# Patient Record
Sex: Female | Born: 1950 | Race: White | Hispanic: No | Marital: Married | Smoking: Never smoker
Health system: Southern US, Community
[De-identification: ages and names within clinical notes are randomized; demographics above are authoritative.]

## PROBLEM LIST (undated history)

## (undated) DIAGNOSIS — M1990 Unspecified osteoarthritis, unspecified site: Secondary | ICD-10-CM

## (undated) DIAGNOSIS — N951 Menopausal and female climacteric states: Secondary | ICD-10-CM

## (undated) DIAGNOSIS — H509 Unspecified strabismus: Secondary | ICD-10-CM

## (undated) DIAGNOSIS — E785 Hyperlipidemia, unspecified: Secondary | ICD-10-CM

## (undated) DIAGNOSIS — N183 Chronic kidney disease, stage 3 unspecified: Secondary | ICD-10-CM

## (undated) DIAGNOSIS — L409 Psoriasis, unspecified: Secondary | ICD-10-CM

## (undated) DIAGNOSIS — Z86010 Personal history of colonic polyps: Secondary | ICD-10-CM

## (undated) DIAGNOSIS — H269 Unspecified cataract: Secondary | ICD-10-CM

## (undated) DIAGNOSIS — R232 Flushing: Secondary | ICD-10-CM

## (undated) DIAGNOSIS — E039 Hypothyroidism, unspecified: Secondary | ICD-10-CM

## (undated) DIAGNOSIS — F419 Anxiety disorder, unspecified: Secondary | ICD-10-CM

## (undated) DIAGNOSIS — T7840XA Allergy, unspecified, initial encounter: Secondary | ICD-10-CM

## (undated) HISTORY — DX: Anxiety disorder, unspecified: F41.9

## (undated) HISTORY — DX: Unspecified strabismus: H50.9

## (undated) HISTORY — DX: Hyperlipidemia, unspecified: E78.5

## (undated) HISTORY — DX: Unspecified osteoarthritis, unspecified site: M19.90

## (undated) HISTORY — DX: Menopausal and female climacteric states: N95.1

## (undated) HISTORY — DX: Hypothyroidism, unspecified: E03.9

## (undated) HISTORY — DX: Psoriasis, unspecified: L40.9

## (undated) HISTORY — DX: Allergy, unspecified, initial encounter: T78.40XA

## (undated) HISTORY — DX: Chronic kidney disease, stage 3 unspecified: N18.30

## (undated) HISTORY — DX: Flushing: R23.2

## (undated) HISTORY — PX: POLYPECTOMY: SHX149

## (undated) HISTORY — DX: Unspecified cataract: H26.9

## (undated) HISTORY — DX: Personal history of colonic polyps: Z86.010

## (undated) HISTORY — PX: TUBAL LIGATION: SHX77

## (undated) HISTORY — PX: COLONOSCOPY: SHX174

## (undated) HISTORY — PX: APPENDECTOMY: SHX54

## (undated) HISTORY — DX: Chronic kidney disease, stage 3 (moderate): N18.3

---

## 1999-05-25 ENCOUNTER — Other Ambulatory Visit: Admission: RE | Admit: 1999-05-25 | Discharge: 1999-05-25 | Payer: Self-pay | Admitting: Obstetrics and Gynecology

## 1999-05-25 ENCOUNTER — Encounter (INDEPENDENT_AMBULATORY_CARE_PROVIDER_SITE_OTHER): Payer: Self-pay

## 1999-08-15 ENCOUNTER — Other Ambulatory Visit: Admission: RE | Admit: 1999-08-15 | Discharge: 1999-08-15 | Payer: Self-pay | Admitting: Obstetrics and Gynecology

## 1999-09-14 ENCOUNTER — Encounter: Payer: Self-pay | Admitting: Obstetrics and Gynecology

## 1999-09-14 ENCOUNTER — Encounter: Admission: RE | Admit: 1999-09-14 | Discharge: 1999-09-14 | Payer: Self-pay | Admitting: Obstetrics and Gynecology

## 2000-11-14 ENCOUNTER — Other Ambulatory Visit: Admission: RE | Admit: 2000-11-14 | Discharge: 2000-11-14 | Payer: Self-pay | Admitting: Obstetrics and Gynecology

## 2000-12-05 ENCOUNTER — Encounter: Payer: Self-pay | Admitting: Obstetrics and Gynecology

## 2000-12-05 ENCOUNTER — Encounter: Admission: RE | Admit: 2000-12-05 | Discharge: 2000-12-05 | Payer: Self-pay | Admitting: Obstetrics and Gynecology

## 2001-11-20 ENCOUNTER — Other Ambulatory Visit: Admission: RE | Admit: 2001-11-20 | Discharge: 2001-11-20 | Payer: Self-pay | Admitting: Obstetrics and Gynecology

## 2001-12-18 ENCOUNTER — Encounter: Payer: Self-pay | Admitting: Obstetrics and Gynecology

## 2001-12-18 ENCOUNTER — Encounter: Admission: RE | Admit: 2001-12-18 | Discharge: 2001-12-18 | Payer: Self-pay | Admitting: Obstetrics and Gynecology

## 2002-11-24 ENCOUNTER — Other Ambulatory Visit: Admission: RE | Admit: 2002-11-24 | Discharge: 2002-11-24 | Payer: Self-pay | Admitting: Obstetrics and Gynecology

## 2003-03-09 ENCOUNTER — Encounter: Payer: Self-pay | Admitting: Obstetrics and Gynecology

## 2003-03-09 ENCOUNTER — Encounter: Admission: RE | Admit: 2003-03-09 | Discharge: 2003-03-09 | Payer: Self-pay | Admitting: Obstetrics and Gynecology

## 2004-03-07 ENCOUNTER — Encounter: Payer: Self-pay | Admitting: Internal Medicine

## 2004-08-31 ENCOUNTER — Encounter: Admission: RE | Admit: 2004-08-31 | Discharge: 2004-08-31 | Payer: Self-pay | Admitting: Obstetrics and Gynecology

## 2004-10-12 ENCOUNTER — Ambulatory Visit: Payer: Self-pay | Admitting: Internal Medicine

## 2005-01-18 ENCOUNTER — Other Ambulatory Visit: Admission: RE | Admit: 2005-01-18 | Discharge: 2005-01-18 | Payer: Self-pay | Admitting: Obstetrics and Gynecology

## 2006-01-03 ENCOUNTER — Ambulatory Visit: Payer: Self-pay | Admitting: Internal Medicine

## 2006-01-22 ENCOUNTER — Ambulatory Visit: Payer: Self-pay | Admitting: Internal Medicine

## 2006-02-05 ENCOUNTER — Encounter: Admission: RE | Admit: 2006-02-05 | Discharge: 2006-02-05 | Payer: Self-pay | Admitting: Obstetrics and Gynecology

## 2006-04-17 ENCOUNTER — Ambulatory Visit: Payer: Self-pay | Admitting: Internal Medicine

## 2006-10-10 ENCOUNTER — Ambulatory Visit: Payer: Self-pay | Admitting: Internal Medicine

## 2006-10-28 DIAGNOSIS — Z86010 Personal history of colonic polyps: Secondary | ICD-10-CM

## 2006-10-28 DIAGNOSIS — Z860101 Personal history of adenomatous and serrated colon polyps: Secondary | ICD-10-CM

## 2006-10-28 HISTORY — DX: Personal history of colonic polyps: Z86.010

## 2006-10-28 HISTORY — DX: Personal history of adenomatous and serrated colon polyps: Z86.0101

## 2006-10-28 LAB — CONVERTED CEMR LAB: Pap Smear: NORMAL

## 2007-02-19 ENCOUNTER — Encounter: Admission: RE | Admit: 2007-02-19 | Discharge: 2007-02-19 | Payer: Self-pay | Admitting: Obstetrics and Gynecology

## 2007-02-19 ENCOUNTER — Encounter: Payer: Self-pay | Admitting: Internal Medicine

## 2007-03-08 HISTORY — PX: COLONOSCOPY W/ POLYPECTOMY: SHX1380

## 2007-04-27 DIAGNOSIS — F411 Generalized anxiety disorder: Secondary | ICD-10-CM | POA: Insufficient documentation

## 2007-05-14 ENCOUNTER — Ambulatory Visit: Payer: Self-pay | Admitting: Internal Medicine

## 2007-05-14 LAB — CONVERTED CEMR LAB
ALT: 14 units/L (ref 0–35)
AST: 13 units/L (ref 0–37)
Albumin: 3.7 g/dL (ref 3.5–5.2)
Alkaline Phosphatase: 71 units/L (ref 39–117)
BUN: 12 mg/dL (ref 6–23)
Basophils Absolute: 0 10*3/uL (ref 0.0–0.1)
Basophils Relative: 0.5 % (ref 0.0–1.0)
Bilirubin, Direct: 0.1 mg/dL (ref 0.0–0.3)
CO2: 30 meq/L (ref 19–32)
Calcium: 9.3 mg/dL (ref 8.4–10.5)
Chloride: 104 meq/L (ref 96–112)
Cholesterol: 235 mg/dL (ref 0–200)
Creatinine, Ser: 1.1 mg/dL (ref 0.4–1.2)
Direct LDL: 146.8 mg/dL
Eosinophils Absolute: 0.1 10*3/uL (ref 0.0–0.6)
Eosinophils Relative: 1.2 % (ref 0.0–5.0)
GFR calc Af Amer: 66 mL/min
GFR calc non Af Amer: 54 mL/min
Glucose, Bld: 81 mg/dL (ref 70–99)
HCT: 37.6 % (ref 36.0–46.0)
HDL: 70.7 mg/dL (ref >39.0)
Hemoglobin: 13 g/dL (ref 12.0–15.0)
Lymphocytes Relative: 30.6 % (ref 12.0–46.0)
MCHC: 34.7 g/dL (ref 30.0–36.0)
MCV: 93.5 fL (ref 78.0–100.0)
Monocytes Absolute: 0.3 10*3/uL (ref 0.2–0.7)
Monocytes Relative: 6.2 % (ref 3.0–11.0)
Neutro Abs: 3.1 10*3/uL (ref 1.4–7.7)
Neutrophils Relative %: 61.5 % (ref 43.0–77.0)
Platelets: 291 10*3/uL (ref 150–400)
Potassium: 4.5 meq/L (ref 3.5–5.1)
RBC: 4.02 M/uL (ref 3.87–5.11)
RDW: 11.8 % (ref 11.5–14.6)
Sodium: 140 meq/L (ref 135–145)
TSH: 5.49 microintl units/mL (ref 0.35–5.50)
Total Bilirubin: 0.6 mg/dL (ref 0.3–1.2)
Total CHOL/HDL Ratio: 3.3
Total Protein: 6.9 g/dL (ref 6.0–8.3)
Triglycerides: 134 mg/dL (ref 0–149)
VLDL: 27 mg/dL (ref 0–40)
WBC: 5 10*3/uL (ref 4.5–10.5)

## 2007-05-21 ENCOUNTER — Ambulatory Visit: Payer: Self-pay | Admitting: Internal Medicine

## 2008-02-22 ENCOUNTER — Encounter: Admission: RE | Admit: 2008-02-22 | Discharge: 2008-02-22 | Payer: Self-pay | Admitting: Internal Medicine

## 2008-03-28 LAB — CONVERTED CEMR LAB: Pap Smear: NORMAL

## 2008-11-10 ENCOUNTER — Ambulatory Visit: Payer: Self-pay | Admitting: Internal Medicine

## 2008-11-10 LAB — CONVERTED CEMR LAB
Albumin: 3.8 g/dL (ref 3.5–5.2)
Alkaline Phosphatase: 70 units/L (ref 39–117)
BUN: 9 mg/dL (ref 6–23)
Bilirubin Urine: NEGATIVE
Bilirubin, Direct: 0.1 mg/dL (ref 0.0–0.3)
Blood in Urine, dipstick: NEGATIVE
Chloride: 105 meq/L (ref 96–112)
Creatinine, Ser: 0.8 mg/dL (ref 0.4–1.2)
Direct LDL: 143.4 mg/dL
Eosinophils Relative: 0.9 % (ref 0.0–5.0)
GFR calc non Af Amer: 78 mL/min
Glucose, Urine, Semiquant: NEGATIVE
HDL: 62.4 mg/dL (ref >39.0)
Lymphocytes Relative: 36.5 % (ref 12.0–46.0)
MCV: 94.5 fL (ref 78.0–100.0)
Monocytes Absolute: 0.3 10*3/uL (ref 0.1–1.0)
Monocytes Relative: 6.2 % (ref 3.0–12.0)
Nitrite: NEGATIVE
Protein, U semiquant: NEGATIVE
RBC: 3.94 M/uL (ref 3.87–5.11)
Sodium: 140 meq/L (ref 135–145)
Total Protein: 6.8 g/dL (ref 6.0–8.3)
WBC Urine, dipstick: NEGATIVE
pH: 7

## 2008-11-17 ENCOUNTER — Ambulatory Visit: Payer: Self-pay | Admitting: Internal Medicine

## 2009-02-22 ENCOUNTER — Encounter: Admission: RE | Admit: 2009-02-22 | Discharge: 2009-02-22 | Payer: Self-pay | Admitting: Obstetrics and Gynecology

## 2010-01-10 ENCOUNTER — Ambulatory Visit: Payer: Self-pay | Admitting: Internal Medicine

## 2010-01-10 LAB — CONVERTED CEMR LAB
Basophils Relative: 0.2 % (ref 0.0–3.0)
Bilirubin Urine: NEGATIVE
Bilirubin, Direct: 0.1 mg/dL (ref 0.0–0.3)
Chloride: 109 meq/L (ref 96–112)
Cholesterol: 232 mg/dL — ABNORMAL HIGH (ref 0–200)
Creatinine, Ser: 1 mg/dL (ref 0.4–1.2)
Direct LDL: 145.7 mg/dL
Eosinophils Relative: 1.1 % (ref 0.0–5.0)
Glucose, Bld: 92 mg/dL (ref 70–99)
HCT: 37.2 % (ref 36.0–46.0)
Ketones, urine, test strip: NEGATIVE
MCV: 96.2 fL (ref 78.0–100.0)
Monocytes Absolute: 0.3 10*3/uL (ref 0.1–1.0)
Monocytes Relative: 7.1 % (ref 3.0–12.0)
Nitrite: NEGATIVE
Protein, U semiquant: NEGATIVE
RDW: 12.4 % (ref 11.5–14.6)
Sodium: 142 meq/L (ref 135–145)
TSH: 4.64 microintl units/mL (ref 0.35–5.50)
Total Protein: 7.1 g/dL (ref 6.0–8.3)
Triglycerides: 127 mg/dL (ref 0.0–149.0)
VLDL: 25.4 mg/dL (ref 0.0–40.0)
WBC Urine, dipstick: NEGATIVE
WBC: 4.5 10*3/uL (ref 4.5–10.5)

## 2010-02-23 ENCOUNTER — Ambulatory Visit: Payer: Self-pay | Admitting: Internal Medicine

## 2010-02-26 ENCOUNTER — Telehealth (INDEPENDENT_AMBULATORY_CARE_PROVIDER_SITE_OTHER): Payer: Self-pay | Admitting: *Deleted

## 2010-04-03 ENCOUNTER — Encounter: Admission: RE | Admit: 2010-04-03 | Discharge: 2010-04-03 | Payer: Self-pay | Admitting: Obstetrics and Gynecology

## 2010-04-03 LAB — HM MAMMOGRAPHY

## 2010-04-10 ENCOUNTER — Encounter: Admission: RE | Admit: 2010-04-10 | Discharge: 2010-04-10 | Payer: Self-pay | Admitting: Obstetrics and Gynecology

## 2010-04-12 ENCOUNTER — Encounter: Admission: RE | Admit: 2010-04-12 | Discharge: 2010-04-12 | Payer: Self-pay | Admitting: Obstetrics and Gynecology

## 2010-11-27 NOTE — Progress Notes (Signed)
  Phone Note Outgoing Call Call back at cell   Call placed by: Rita Ohara Call placed to: Patient Summary of Call: Called patient to ask about lab work from 02-13-10. She stated that Dr Cato Mulligan didn't want labs becuse they were already done in march. Canceled labs.  Initial call taken by: Rita Ohara

## 2010-11-27 NOTE — Assessment & Plan Note (Signed)
Summary: CPX/NJR-----PT RSC (BMP) // RS//pt rescd//ccm   Vital Signs:  Patient profile:   60 year old female Menstrual status:  postmenopausal Height:      66.5 inches Weight:      125 pounds BMI:     19.95 Pulse rate:   72 / minute Pulse rhythm:   regular Resp:     12 per minute BP sitting:   110 / 66  (left arm) Cuff size:   regular  Vitals Entered By: Gladis Riffle, RN (23-Mar-2010 10:35 AM) CC: cpx, labs done--has gyn--mother and mother-in-law deceased since 2010-04-10Is Patient Diabetic? No     Menstrual Status postmenopausal Last PAP Result normal-pt's report   CC:  cpx and labs done--has gyn--mother and mother-in-law deceased since 02-04-09.  History of Present Illness: cpx  Preventive Screening-Counseling & Management  Alcohol-Tobacco     Smoking Status: never  Current Problems (verified): 1)  Examination, Routine Medical  (ICD-V70.0) 2)  Family History Diabetes 1st Degree Relative  (ICD-V18.0) 3)  Anxiety  (ICD-300.00)  Current Medications (verified): 1)  Budeprion Xl 300 Mg Tb24 (Bupropion Hcl) .... Take 1 Tablet By Mouth Once A Day 2)  Prempro 0.3-1.5 Mg Tabs (Conj Estrog-Medroxyprogest Ace) .... Take 1 Once A Day 3)  Otc Sinus Headache Medication .... As Needed  Allergies: 1)  Sulfamethoxazole (Sulfamethoxazole)  Past History:  Past Medical History: Last updated: 04/27/2007 Anxiety, situational  Past Surgical History: Last updated: 11/17/2008 Appendectomy  Family History: Last updated: 2010-03-23 father deceased 65 yo, [place], CHF Family History Diabetes 1st degree relative mother and father Mother-deceased sepsis -GI source? DM (age 52)  Social History: Last updated: 11/17/2008 Retired-previous call center Never Smoked Alcohol use-no Regular exercise-no  Risk Factors: Exercise: no (05/21/2007)  Risk Factors: Smoking Status: never (Mar 23, 2010)  Family History: father deceased 91 yo, MI, CHF Family History Diabetes 1st degree  relative mother and father Mother-deceased sepsis -GI source? DM (age 27)  Review of Systems       All other systems reviewed and were negative     Impression & Recommendations:  Problem # 1:  EXAMINATION, ROUTINE MEDICAL (ICD-V70.0)  Health maint UTD she sees GYN,  she will schedule Mammogram  Orders: Venipuncture (54098) TLB-Lipid Panel (80061-LIPID) TLB-BMP (Basic Metabolic Panel-BMET) (80048-METABOL) TLB-CBC Platelet - w/Differential (85025-CBCD) TLB-Hepatic/Liver Function Pnl (80076-HEPATIC) TLB-TSH (Thyroid Stimulating Hormone) (84443-TSH) UA Dipstick w/o Micro (automated)  (81003)  Complete Medication List: 1)  Budeprion Xl 300 Mg Tb24 (Bupropion hcl) .... Take 1 tablet by mouth once a day 2)  Prempro 0.3-1.5 Mg Tabs (Conj estrog-medroxyprogest ace) .... Take 1 once a day 3)  Otc Sinus Headache Medication  .... As needed  Patient Instructions: 1)  Schedule your mammogram. 2)  You need to have a Pap Smear to prevent cervical cancer. Physical Exam General Appearance: well developed, well nourished, no acute distress Eyes: conjunctiva and lids normal, PERRL, EOMI, Ears, Nose, Mouth, Throat: TM clear, nares clear, oral exam WNL Neck: supple, no lymphadenopathy, no thyromegaly, no JVD Respiratory: clear to auscultation and percussion, respiratory effort normal Cardiovascular: regular rate and rhythm, S1-S2, no murmur, rub or gallop, no bruits, peripheral pulses normal and symmetric, no cyanosis, clubbing, edema or varicosities Chest: no scars, masses, tenderness; no asymmetry, skin changes,  Gastrointestinal: soft, non-tender; no hepatosplenomegaly, masses; active bowel sounds all quadrants, Lymphatic: no cervical, axillary or inguinal adenopathy Musculoskeletal: gait normal, muscle tone and strength WNL, no joint swelling, effusions, discoloration, crepitus  Skin: clear, good turgor, color WNL,  no rashes, lesions, or ulcerations Neurologic: normal mental status,  normal reflexes, normal strength, sensation, and motion Psychiatric: alert; oriented to person, place and time Other Exam:

## 2011-03-13 ENCOUNTER — Other Ambulatory Visit: Payer: Self-pay | Admitting: Internal Medicine

## 2011-03-13 DIAGNOSIS — N63 Unspecified lump in unspecified breast: Secondary | ICD-10-CM

## 2011-03-13 DIAGNOSIS — Z09 Encounter for follow-up examination after completed treatment for conditions other than malignant neoplasm: Secondary | ICD-10-CM

## 2011-04-03 ENCOUNTER — Other Ambulatory Visit (INDEPENDENT_AMBULATORY_CARE_PROVIDER_SITE_OTHER): Payer: BC Managed Care – PPO

## 2011-04-03 DIAGNOSIS — Z0000 Encounter for general adult medical examination without abnormal findings: Secondary | ICD-10-CM

## 2011-04-03 LAB — LIPID PANEL
Cholesterol: 283 mg/dL — ABNORMAL HIGH (ref 0–200)
Total CHOL/HDL Ratio: 3
Triglycerides: 169 mg/dL — ABNORMAL HIGH (ref 0.0–149.0)

## 2011-04-03 LAB — CBC WITH DIFFERENTIAL/PLATELET
Eosinophils Absolute: 0.1 10*3/uL (ref 0.0–0.7)
HCT: 37.7 % (ref 36.0–46.0)
Lymphocytes Relative: 28.6 % (ref 12.0–46.0)
MCV: 95.2 fl (ref 78.0–100.0)
Monocytes Relative: 6.5 % (ref 3.0–12.0)
Neutrophils Relative %: 63.7 % (ref 43.0–77.0)
RBC: 3.96 Mil/uL (ref 3.87–5.11)
RDW: 13.4 % (ref 11.5–14.6)
WBC: 5.4 10*3/uL (ref 4.5–10.5)

## 2011-04-03 LAB — POCT URINALYSIS DIPSTICK: Nitrite, UA: NEGATIVE

## 2011-04-03 LAB — HEPATIC FUNCTION PANEL
ALT: 11 U/L (ref 0–35)
AST: 14 U/L (ref 0–37)
Albumin: 4.2 g/dL (ref 3.5–5.2)
Bilirubin, Direct: 0.1 mg/dL (ref 0.0–0.3)
Total Bilirubin: 0.4 mg/dL (ref 0.3–1.2)
Total Protein: 7.4 g/dL (ref 6.0–8.3)

## 2011-04-03 LAB — BASIC METABOLIC PANEL
BUN: 11 mg/dL (ref 6–23)
Calcium: 9.2 mg/dL (ref 8.4–10.5)
Creatinine, Ser: 1.1 mg/dL (ref 0.4–1.2)
Potassium: 4.2 mEq/L (ref 3.5–5.1)

## 2011-04-03 LAB — LDL CHOLESTEROL, DIRECT: Direct LDL: 190.6 mg/dL

## 2011-04-05 ENCOUNTER — Ambulatory Visit
Admission: RE | Admit: 2011-04-05 | Discharge: 2011-04-05 | Disposition: A | Discharge status: Home / Self Care | Payer: BC Managed Care – PPO | Source: Ambulatory Visit | Attending: Internal Medicine | Admitting: Internal Medicine

## 2011-04-05 DIAGNOSIS — N63 Unspecified lump in unspecified breast: Secondary | ICD-10-CM

## 2011-04-09 ENCOUNTER — Encounter: Payer: Self-pay | Admitting: Internal Medicine

## 2011-04-10 ENCOUNTER — Ambulatory Visit (INDEPENDENT_AMBULATORY_CARE_PROVIDER_SITE_OTHER): Payer: BC Managed Care – PPO | Admitting: Internal Medicine

## 2011-04-10 VITALS — BP 112/80 | HR 84 | Temp 98.3°F | Ht 66.0 in | Wt 129.0 lb

## 2011-04-10 DIAGNOSIS — E785 Hyperlipidemia, unspecified: Secondary | ICD-10-CM | POA: Insufficient documentation

## 2011-04-10 DIAGNOSIS — Z2911 Encounter for prophylactic immunotherapy for respiratory syncytial virus (RSV): Secondary | ICD-10-CM

## 2011-04-10 DIAGNOSIS — Z0000 Encounter for general adult medical examination without abnormal findings: Secondary | ICD-10-CM

## 2011-04-10 DIAGNOSIS — F329 Major depressive disorder, single episode, unspecified: Secondary | ICD-10-CM

## 2011-04-10 DIAGNOSIS — F32A Depression, unspecified: Secondary | ICD-10-CM

## 2011-04-10 MED ORDER — BUPROPION HCL ER (XL) 300 MG PO TB24: 300.0000 mg | ORAL_TABLET | Freq: Every day | ORAL | Status: DC

## 2011-04-10 NOTE — Progress Notes (Signed)
  Subjective:    Patient ID: Teresa Roth, female    DOB: Jun 06, 1951, 60 y.o.   MRN: [id number]  HPI cpx  Past Medical History  Diagnosis Date  . Anxiety    Past Surgical History  Procedure Date  . Appendectomy     reports that she has never smoked. She does not have any smokeless tobacco history on file. She reports that she does not drink alcohol. Her drug history not on file. family history includes Diabetes in her father and mother and Heart failure in her father. Allergies  Allergen Reactions  . Sulfamethoxazole     REACTION: rash    Review of Systems  patient denies chest pain, shortness of breath, orthopnea. Denies lower extremity edema, abdominal pain, change in appetite, change in bowel movements. Patient denies rashes, musculoskeletal complaints. No other specific complaints in a complete review of systems.      Objective:   Physical Exam  Well-developed well-nourished female in no acute distress. HEENT exam atraumatic, normocephalic, extraocular muscles are intact. Neck is supple. No jugular venous distention no thyromegaly. Chest clear to auscultation without increased work of breathing. Cardiac exam S1 and S2 are regular. Abdominal exam active bowel sounds, soft, nontender. Extremities no edema. Neurologic exam she is alert without any motor sensory deficits. Gait is normal.      Assessment & Plan:  Well visit  Health maint UTD

## 2011-04-10 NOTE — Assessment & Plan Note (Signed)
Discussed She admits to poor diet Advised low fat diet, regular exercise Check labs in 3 months

## 2011-07-10 ENCOUNTER — Other Ambulatory Visit (INDEPENDENT_AMBULATORY_CARE_PROVIDER_SITE_OTHER): Payer: BC Managed Care – PPO

## 2011-07-10 DIAGNOSIS — E785 Hyperlipidemia, unspecified: Secondary | ICD-10-CM

## 2011-07-10 LAB — LIPID PANEL
HDL: 65.2 mg/dL (ref >39.00)
VLDL: 32.4 mg/dL (ref 0.0–40.0)

## 2011-07-10 LAB — HEPATIC FUNCTION PANEL
ALT: 9 U/L (ref 0–35)
AST: 12 U/L (ref 0–37)
Albumin: 3.9 g/dL (ref 3.5–5.2)
Total Bilirubin: 0.5 mg/dL (ref 0.3–1.2)

## 2011-07-17 ENCOUNTER — Ambulatory Visit: Payer: BC Managed Care – PPO | Admitting: Internal Medicine

## 2012-08-03 ENCOUNTER — Other Ambulatory Visit: Payer: Self-pay | Admitting: Internal Medicine

## 2012-08-03 ENCOUNTER — Telehealth: Payer: Self-pay | Admitting: Internal Medicine

## 2012-08-03 DIAGNOSIS — Z1231 Encounter for screening mammogram for malignant neoplasm of breast: Secondary | ICD-10-CM

## 2012-08-03 DIAGNOSIS — F32A Depression, unspecified: Secondary | ICD-10-CM

## 2012-08-03 DIAGNOSIS — F329 Major depressive disorder, single episode, unspecified: Secondary | ICD-10-CM

## 2012-08-03 MED ORDER — BUPROPION HCL ER (XL) 300 MG PO TB24: 300.0000 mg | ORAL_TABLET | Freq: Every day | ORAL | Status: DC

## 2012-08-03 NOTE — Telephone Encounter (Signed)
rx sent in electronically 

## 2012-08-03 NOTE — Telephone Encounter (Signed)
Pt called req refill of buPROPion (WELLBUTRIN XL) 300 MG 24 hr tablet to [facility]. Pt has sch cpx with Dr Cato Mulligan in Dec 2013.

## 2012-09-08 ENCOUNTER — Ambulatory Visit
Admission: RE | Admit: 2012-09-08 | Discharge: 2012-09-08 | Disposition: A | Discharge status: Home / Self Care | Payer: BC Managed Care – PPO | Source: Ambulatory Visit | Attending: Internal Medicine | Admitting: Internal Medicine

## 2012-09-08 DIAGNOSIS — Z1231 Encounter for screening mammogram for malignant neoplasm of breast: Secondary | ICD-10-CM

## 2012-09-27 LAB — HM PAP SMEAR

## 2012-10-06 ENCOUNTER — Other Ambulatory Visit (INDEPENDENT_AMBULATORY_CARE_PROVIDER_SITE_OTHER): Payer: BC Managed Care – PPO

## 2012-10-06 DIAGNOSIS — Z0000 Encounter for general adult medical examination without abnormal findings: Secondary | ICD-10-CM

## 2012-10-06 LAB — BASIC METABOLIC PANEL
Calcium: 8.9 mg/dL (ref 8.4–10.5)
GFR: 58.09 mL/min — ABNORMAL LOW (ref >60.00)
Sodium: 138 mEq/L (ref 135–145)

## 2012-10-06 LAB — HEPATIC FUNCTION PANEL
ALT: 12 U/L (ref 0–35)
AST: 15 U/L (ref 0–37)
Total Bilirubin: 0.2 mg/dL — ABNORMAL LOW (ref 0.3–1.2)

## 2012-10-06 LAB — POCT URINALYSIS DIPSTICK
Ketones, UA: NEGATIVE
Protein, UA: NEGATIVE
Spec Grav, UA: >=1.03
pH, UA: 5

## 2012-10-06 LAB — CBC WITH DIFFERENTIAL/PLATELET
Basophils Absolute: 0 10*3/uL (ref 0.0–0.1)
HCT: 36.8 % (ref 36.0–46.0)
Lymphs Abs: 1.5 10*3/uL (ref 0.7–4.0)
MCV: 94.3 fl (ref 78.0–100.0)
Monocytes Absolute: 0.3 10*3/uL (ref 0.1–1.0)
Platelets: 259 10*3/uL (ref 150.0–400.0)
RDW: 13.2 % (ref 11.5–14.6)

## 2012-10-06 LAB — LIPID PANEL
Cholesterol: 225 mg/dL — ABNORMAL HIGH (ref 0–200)
HDL: 63.5 mg/dL (ref >39.00)
Triglycerides: 75 mg/dL (ref 0.0–149.0)

## 2012-10-06 LAB — TSH: TSH: 5.03 u[IU]/mL (ref 0.35–5.50)

## 2012-10-13 ENCOUNTER — Encounter: Payer: Self-pay | Admitting: Internal Medicine

## 2012-10-13 ENCOUNTER — Ambulatory Visit (INDEPENDENT_AMBULATORY_CARE_PROVIDER_SITE_OTHER): Payer: BC Managed Care – PPO | Admitting: Internal Medicine

## 2012-10-13 VITALS — BP 112/72 | HR 84 | Temp 98.1°F | Ht 67.0 in | Wt 133.0 lb

## 2012-10-13 DIAGNOSIS — Z0000 Encounter for general adult medical examination without abnormal findings: Secondary | ICD-10-CM

## 2012-10-13 DIAGNOSIS — Z23 Encounter for immunization: Secondary | ICD-10-CM

## 2012-10-13 NOTE — Progress Notes (Signed)
Patient ID: Teresa Roth, female   DOB: December 23, 1950, 61 y.o.   MRN: [id number] cpx  Past Medical History  Diagnosis Date  . Anxiety     History   Social History  . Marital Status: Married    Spouse Name: N/A    Number of Children: N/A  . Years of Education: N/A   Occupational History  . Not on file.   Social History Main Topics  . Smoking status: Never Smoker   . Smokeless tobacco: Not on file  . Alcohol Use: No  . Drug Use:   . Sexually Active:    Other Topics Concern  . Not on file   Social History Narrative  . No narrative on file    Past Surgical History  Procedure Date  . Appendectomy     Family History  Problem Relation Age of Onset  . Diabetes Mother   . Heart failure Father   . Diabetes Father     Allergies  Allergen Reactions  . Sulfamethoxazole     REACTION: rash    Current Outpatient Prescriptions on File Prior to Visit  Medication Sig Dispense Refill  . buPROPion (WELLBUTRIN XL) 300 MG 24 hr tablet Take 150 mg by mouth daily.      Marland Kitchen estrogen, conjugated,-medroxyprogesterone (PREMPRO) 0.3-1.5 MG per tablet Take 0.5 tablets by mouth daily.          patient denies chest pain, shortness of breath, orthopnea. Denies lower extremity edema, abdominal pain, change in appetite, change in bowel movements. Patient denies rashes, musculoskeletal complaints. No other specific complaints in a complete review of systems.   BP 112/72  Pulse 84  Temp 98.1 F (36.7 C) (Oral)  Ht 5\' 7"  (1.702 m)  Wt 133 lb (60.328 kg)  BMI 20.83 kg/m2  Well-developed well-nourished female in no acute distress. HEENT exam atraumatic, normocephalic, extraocular muscles are intact. Neck is supple. No jugular venous distention no thyromegaly. Chest clear to auscultation without increased work of breathing. Cardiac exam S1 and S2 are regular. Abdominal exam active bowel sounds, soft, nontender. Extremities no edema. Neurologic exam she is alert without any motor sensory deficits.  Gait is normal.   A/P- Well Visit- health maint utd

## 2012-10-14 DIAGNOSIS — Z23 Encounter for immunization: Secondary | ICD-10-CM

## 2013-02-04 ENCOUNTER — Telehealth: Payer: Self-pay | Admitting: Internal Medicine

## 2013-02-04 NOTE — Telephone Encounter (Signed)
Pt needs refill of buPROPion (WELLBUTRIN XL) 300 MG 24 hr tablet. Pt states she discussed in December 2013 w/ MD that she was cutting the pills in half, and takes 1/2 the dose. MD told her she should not do that, but call when she needs refill. Pt would like take the lesser dose if OK w/ MD.  Pls advise. Pharm: Medical laboratory scientific officer

## 2013-02-04 NOTE — Telephone Encounter (Signed)
Change to 150 mg po qd. #90/ 3 refills

## 2013-02-05 MED ORDER — BUPROPION HCL ER (XL) 150 MG PO TB24: 150.0000 mg | ORAL_TABLET | Freq: Every day | ORAL | Status: DC

## 2013-02-05 NOTE — Telephone Encounter (Signed)
rx sent in electronically 

## 2013-08-27 ENCOUNTER — Other Ambulatory Visit: Payer: Self-pay

## 2013-08-27 DIAGNOSIS — Z1231 Encounter for screening mammogram for malignant neoplasm of breast: Secondary | ICD-10-CM

## 2013-10-04 ENCOUNTER — Ambulatory Visit
Admission: RE | Admit: 2013-10-04 | Discharge: 2013-10-04 | Disposition: A | Discharge status: Home / Self Care | Payer: BC Managed Care – PPO | Source: Ambulatory Visit

## 2013-10-04 DIAGNOSIS — Z1231 Encounter for screening mammogram for malignant neoplasm of breast: Secondary | ICD-10-CM

## 2014-01-02 ENCOUNTER — Other Ambulatory Visit: Payer: Self-pay | Admitting: Internal Medicine

## 2014-02-08 ENCOUNTER — Other Ambulatory Visit (INDEPENDENT_AMBULATORY_CARE_PROVIDER_SITE_OTHER): Payer: BC Managed Care – PPO

## 2014-02-08 DIAGNOSIS — Z0000 Encounter for general adult medical examination without abnormal findings: Secondary | ICD-10-CM

## 2014-02-08 LAB — LIPID PANEL
CHOL/HDL RATIO: 4
Cholesterol: 243 mg/dL — ABNORMAL HIGH (ref 0–200)
HDL: 54.9 mg/dL (ref >39.00)
LDL Cholesterol: 160 mg/dL — ABNORMAL HIGH (ref 0–99)
Triglycerides: 139 mg/dL (ref 0.0–149.0)
VLDL: 27.8 mg/dL (ref 0.0–40.0)

## 2014-02-08 LAB — CBC WITH DIFFERENTIAL/PLATELET
BASOS ABS: 0 10*3/uL (ref 0.0–0.1)
Basophils Relative: 0.4 % (ref 0.0–3.0)
EOS ABS: 0.1 10*3/uL (ref 0.0–0.7)
Eosinophils Relative: 1.2 % (ref 0.0–5.0)
HEMATOCRIT: 36.8 % (ref 36.0–46.0)
HEMOGLOBIN: 12.2 g/dL (ref 12.0–15.0)
LYMPHS ABS: 1.6 10*3/uL (ref 0.7–4.0)
Lymphocytes Relative: 31.3 % (ref 12.0–46.0)
MCHC: 33.2 g/dL (ref 30.0–36.0)
MCV: 94.4 fl (ref 78.0–100.0)
Monocytes Absolute: 0.3 10*3/uL (ref 0.1–1.0)
Monocytes Relative: 6.4 % (ref 3.0–12.0)
NEUTROS ABS: 3.1 10*3/uL (ref 1.4–7.7)
Neutrophils Relative %: 60.7 % (ref 43.0–77.0)
Platelets: 314 10*3/uL (ref 150.0–400.0)
RBC: 3.9 Mil/uL (ref 3.87–5.11)
RDW: 13 % (ref 11.5–14.6)
WBC: 5.1 10*3/uL (ref 4.5–10.5)

## 2014-02-08 LAB — BASIC METABOLIC PANEL
BUN: 12 mg/dL (ref 6–23)
CHLORIDE: 103 meq/L (ref 96–112)
CO2: 27 meq/L (ref 19–32)
Calcium: 8.9 mg/dL (ref 8.4–10.5)
Creatinine, Ser: 1 mg/dL (ref 0.4–1.2)
GFR: 57.2 mL/min — ABNORMAL LOW (ref >60.00)
GLUCOSE: 84 mg/dL (ref 70–99)
POTASSIUM: 3.8 meq/L (ref 3.5–5.1)
SODIUM: 137 meq/L (ref 135–145)

## 2014-02-08 LAB — HEPATIC FUNCTION PANEL
ALBUMIN: 3.6 g/dL (ref 3.5–5.2)
ALK PHOS: 69 U/L (ref 39–117)
ALT: 13 U/L (ref 0–35)
AST: 13 U/L (ref 0–37)
Bilirubin, Direct: 0 mg/dL (ref 0.0–0.3)
Total Bilirubin: 0.7 mg/dL (ref 0.3–1.2)
Total Protein: 6.7 g/dL (ref 6.0–8.3)

## 2014-02-08 LAB — TSH: TSH: 3.27 u[IU]/mL (ref 0.35–5.50)

## 2014-02-08 LAB — POCT URINALYSIS DIPSTICK
BILIRUBIN UA: NEGATIVE
Blood, UA: NEGATIVE
Glucose, UA: NEGATIVE
KETONES UA: NEGATIVE
NITRITE UA: NEGATIVE
PROTEIN UA: NEGATIVE
Spec Grav, UA: 1.015
Urobilinogen, UA: 0.2
pH, UA: 6.5

## 2014-02-15 ENCOUNTER — Encounter: Payer: Self-pay | Admitting: Internal Medicine

## 2014-02-15 ENCOUNTER — Ambulatory Visit (INDEPENDENT_AMBULATORY_CARE_PROVIDER_SITE_OTHER): Payer: BC Managed Care – PPO | Admitting: Internal Medicine

## 2014-02-15 VITALS — BP 112/78 | HR 64 | Temp 97.9°F | Ht 64.25 in | Wt 140.0 lb

## 2014-02-15 DIAGNOSIS — Z0000 Encounter for general adult medical examination without abnormal findings: Secondary | ICD-10-CM

## 2014-02-15 MED ORDER — BETAMETHASONE DIPROPIONATE 0.05 % EX CREA: TOPICAL_CREAM | Freq: Two times a day (BID) | CUTANEOUS | Status: DC | PRN

## 2014-02-15 MED ORDER — BUPROPION HCL ER (XL) 150 MG PO TB24: 150.0000 mg | ORAL_TABLET | Freq: Every day | ORAL | Status: DC

## 2014-02-15 NOTE — Progress Notes (Signed)
Pre visit review using our clinic review tool, if applicable. No additional management support is needed unless otherwise documented below in the visit note. 

## 2014-02-15 NOTE — Progress Notes (Signed)
cpx  Past Medical History  Diagnosis Date  . Anxiety     History   Social History  . Marital Status: Married    Spouse Name: N/A    Number of Children: N/A  . Years of Education: N/A   Occupational History  . Not on file.   Social History Main Topics  . Smoking status: Never Smoker   . Smokeless tobacco: Not on file  . Alcohol Use: No  . Drug Use:   . Sexual Activity:    Other Topics Concern  . Not on file   Social History Narrative  . No narrative on file    Past Surgical History  Procedure Laterality Date  . Appendectomy      Family History  Problem Relation Age of Onset  . Diabetes Mother   . Heart failure Father   . Diabetes Father     Allergies  Allergen Reactions  . Sulfamethoxazole     REACTION: rash    Current Outpatient Prescriptions on File Prior to Visit  Medication Sig Dispense Refill  . buPROPion (WELLBUTRIN XL) 150 MG 24 hr tablet TAKE 1 TABLET DAILY  90 tablet  0  . estrogen, conjugated,-medroxyprogesterone (PREMPRO) 0.3-1.5 MG per tablet Take 0.5 tablets by mouth daily.        No current facility-administered medications on file prior to visit.     patient denies chest pain, shortness of breath, orthopnea. Denies lower extremity edema, abdominal pain, change in appetite, change in bowel movements. Patient denies rashes, musculoskeletal complaints. No other specific complaints in a complete review of systems.   Reviewed vitals  Well-developed well-nourished female in no acute distress. HEENT exam atraumatic, normocephalic, extraocular muscles are intact. Neck is supple. No jugular venous distention no thyromegaly. Chest clear to auscultation without increased work of breathing. Cardiac exam S1 and S2 are regular. Abdominal exam active bowel sounds, soft, nontender. Extremities no edema. Neurologic exam she is alert without any motor sensory deficits. Gait is normal.  Well Visit- health maint UTD Psoriatic plaque-- will try steroid  cream  Lipids-- advised daily exercise and low fat diet

## 2015-03-29 ENCOUNTER — Telehealth: Payer: Self-pay | Admitting: Internal Medicine

## 2015-03-29 NOTE — Telephone Encounter (Signed)
Pt request refill buPROPion (WELLBUTRIN XL) 150 MG 24 hr tablet  Pt has appt w/ Dr Anitra Lauth 6;9  but will not have enough to get her through to appt. [facility]?

## 2015-03-29 NOTE — Telephone Encounter (Signed)
Pt has never been seen by Padonda, therefore I cannot authorize a refill under her name. Also, she has not been seen for more than a year and letters for Dr. Leanne Chang were sent out to his patients some time ago. Pt may want to call Dr. Idelle Leech office to see if he will authorize a refill to get her to her appointment with him.

## 2015-03-29 NOTE — Telephone Encounter (Signed)
I sent this to Teresa Roth only because she is refills today and cindy is out. can you send  a 15 day supply [facility]? Pt has appt 6/9 w/ mcGowen.  But afraid she will run out and rx usually goes to mail order. I told pt we could not do a mail order b/c that is 90 days Can we work something out for the pt?

## 2015-03-30 ENCOUNTER — Other Ambulatory Visit: Payer: Self-pay | Admitting: *Deleted

## 2015-03-30 MED ORDER — BUPROPION HCL ER (XL) 150 MG PO TB24: 150.0000 mg | ORAL_TABLET | Freq: Every day | ORAL | Status: DC

## 2015-03-30 NOTE — Telephone Encounter (Signed)
MD Sarajane Jews has approved 30 day refill for Wellbutrin XL 150 mg 24 hr tablet. [facility] [address]

## 2015-04-06 ENCOUNTER — Encounter: Payer: Self-pay | Admitting: Family Medicine

## 2015-04-06 ENCOUNTER — Ambulatory Visit (INDEPENDENT_AMBULATORY_CARE_PROVIDER_SITE_OTHER): Payer: BLUE CROSS/BLUE SHIELD | Admitting: Family Medicine

## 2015-04-06 VITALS — BP 107/71 | HR 91 | Temp 98.0°F | Resp 16 | Ht 65.75 in | Wt 139.0 lb

## 2015-04-06 DIAGNOSIS — Z0000 Encounter for general adult medical examination without abnormal findings: Secondary | ICD-10-CM

## 2015-04-06 DIAGNOSIS — Z86010 Personal history of colonic polyps: Secondary | ICD-10-CM | POA: Insufficient documentation

## 2015-04-06 DIAGNOSIS — E785 Hyperlipidemia, unspecified: Secondary | ICD-10-CM | POA: Diagnosis not present

## 2015-04-06 DIAGNOSIS — Z131 Encounter for screening for diabetes mellitus: Secondary | ICD-10-CM

## 2015-04-06 DIAGNOSIS — Z1211 Encounter for screening for malignant neoplasm of colon: Secondary | ICD-10-CM

## 2015-04-06 DIAGNOSIS — Z23 Encounter for immunization: Secondary | ICD-10-CM | POA: Diagnosis not present

## 2015-04-06 LAB — LIPID PANEL
CHOL/HDL RATIO: 4
Cholesterol: 271 mg/dL — ABNORMAL HIGH (ref 0–200)
HDL: 66.7 mg/dL (ref >39.00)
LDL Cholesterol: 166 mg/dL — ABNORMAL HIGH (ref 0–99)
NonHDL: 204.3
TRIGLYCERIDES: 193 mg/dL — AB (ref 0.0–149.0)
VLDL: 38.6 mg/dL (ref 0.0–40.0)

## 2015-04-06 LAB — COMPREHENSIVE METABOLIC PANEL
ALK PHOS: 97 U/L (ref 39–117)
ALT: 9 U/L (ref 0–35)
AST: 12 U/L (ref 0–37)
Albumin: 4.3 g/dL (ref 3.5–5.2)
BILIRUBIN TOTAL: 0.5 mg/dL (ref 0.2–1.2)
BUN: 13 mg/dL (ref 6–23)
CALCIUM: 9.7 mg/dL (ref 8.4–10.5)
CO2: 27 meq/L (ref 19–32)
Chloride: 102 mEq/L (ref 96–112)
Creatinine, Ser: 1.09 mg/dL (ref 0.40–1.20)
GFR: 53.39 mL/min — AB (ref >60.00)
Glucose, Bld: 87 mg/dL (ref 70–99)
Potassium: 4.8 mEq/L (ref 3.5–5.1)
Sodium: 137 mEq/L (ref 135–145)
Total Protein: 7.2 g/dL (ref 6.0–8.3)

## 2015-04-06 MED ORDER — BUPROPION HCL ER (XL) 150 MG PO TB24: 150.0000 mg | ORAL_TABLET | Freq: Every day | ORAL | Status: DC

## 2015-04-06 NOTE — Progress Notes (Signed)
Pre visit review using our clinic review tool, if applicable. No additional management support is needed unless otherwise documented below in the visit note. 

## 2015-04-06 NOTE — Progress Notes (Signed)
Office Note 04/06/2015  CC:  Chief Complaint  Patient presents with  . Annual Exam    Pt is fasting.    HPI:  Teresa Roth is a 64 y.o. White female who is here to transfer care and get CPE (fasting). Patient's most recent primary MD: Dr. Leanne Chang at [facility]  GYN is Dr. Willis Modena. Old records in EPIC/HL were reviewed prior to or during today's visit.  Reviewed last CPE done by Dr. Leanne Chang 02/18/14. Dec 2015 she got her mammogram and pap smear updated through her GYN: all normal.  She walks some.  Past Medical History  Diagnosis Date  . Anxiety   . History of adenomatous polyp of colon 2008  . Hyperlipidemia     TLC  . Strabismus     L eye; sees ophthalm  . Vasomotor flushing     postmenopausal    Past Surgical History  Procedure Laterality Date  . Appendectomy    . Colonoscopy w/ polypectomy  03/08/07    Adenomatous polyp-    Family History  Problem Relation Age of Onset  . Diabetes Mother   . Heart failure Father   . Diabetes Father     History   Social History  . Marital Status: Married    Spouse Name: N/A  . Number of Children: N/A  . Years of Education: N/A   Occupational History  . Not on file.   Social History Main Topics  . Smoking status: Never Smoker   . Smokeless tobacco: Not on file  . Alcohol Use: No  . Drug Use: Not on file  . Sexual Activity: Not on file   Other Topics Concern  . Not on file   Social History Narrative   Married, one son.   Orig from [place] area.   Occupation: retired from [employer] and [employer].   No tob, no alc.       Outpatient Encounter Prescriptions as of 04/06/2015  Medication Sig  . betamethasone dipropionate (DIPROLENE) 0.05 % cream Apply topically 2 (two) times daily as needed.  Marland Kitchen buPROPion (WELLBUTRIN XL) 150 MG 24 hr tablet Take 1 tablet (150 mg total) by mouth daily.  Marland Kitchen estrogen, conjugated,-medroxyprogesterone (PREMPRO) 0.3-1.5 MG per tablet Take 0.5 tablets by mouth every other day.   .  [DISCONTINUED] buPROPion (WELLBUTRIN XL) 150 MG 24 hr tablet Take 1 tablet (150 mg total) by mouth daily.   No facility-administered encounter medications on file as of 04/06/2015.    Allergies  Allergen Reactions  . Sulfamethoxazole     REACTION: rash    ROS Review of Systems  Constitutional: Negative for fever and weight loss.  HENT: Negative for congestion and hearing loss.   Eyes: Negative for photophobia and redness.  Respiratory: Negative for cough and shortness of breath.   Cardiovascular: Negative for chest pain, palpitations and leg swelling.  Gastrointestinal: Negative for heartburn, nausea, abdominal pain and blood in stool.  Genitourinary: Negative for dysuria and frequency.  Musculoskeletal: Negative for myalgias, back pain and joint pain.  Neurological: Negative for dizziness, weakness and headaches.  Endo/Heme/Allergies: Negative for polydipsia.  Psychiatric/Behavioral: Negative for depression. The patient has insomnia.    PE; Blood pressure 107/71, pulse 91, temperature 98 F (36.7 C), temperature source Oral, resp. rate 16, height 5' 5.75" (1.67 m), weight 139 lb (63.05 kg), SpO2 99 %. Gen: Alert, well appearing.  Patient is oriented to person, place, time, and situation. AFFECT: pleasant, lucid thought and speech. ENT: Ears: EACs clear, normal epithelium.  TMs with good light reflex and landmarks bilaterally.  Eyes: no injection, icteris, swelling, or exudate.  EOMI, PERRLA. Nose: no drainage or turbinate edema/swelling.  No injection or focal lesion.  Mouth: lips without lesion/swelling.  Oral mucosa pink and moist.  Dentition intact and without obvious caries or gingival swelling.  Oropharynx without erythema, exudate, or swelling.  Neck: supple/nontender.  No LAD, mass, or TM.  Carotid pulses 2+ bilaterally, without bruits. CV: RRR, no m/r/g.   LUNGS: CTA bilat, nonlabored resps, good aeration in all lung fields. ABD: soft, NT, ND, BS normal.  No  hepatospenomegaly or mass.  No bruits. EXT: no clubbing, cyanosis, or edema.  Musculoskeletal: no joint swelling, erythema, warmth, or tenderness.  ROM of all joints intact. Skin - no sores or suspicious lesions or rashes or color changes   Pertinent labs:  Lab Results  Component Value Date   CHOL 243* 02/08/2014   HDL 54.90 02/08/2014   LDLCALC 160* 02/08/2014   LDLDIRECT 146.5 10/06/2012   TRIG 139.0 02/08/2014   CHOLHDL 4 02/08/2014     Chemistry      Component Value Date/Time   NA 137 02/08/2014 0851   K 3.8 02/08/2014 0851   CL 103 02/08/2014 0851   CO2 27 02/08/2014 0851   BUN 12 02/08/2014 0851   CREATININE 1.0 02/08/2014 0851      Component Value Date/Time   CALCIUM 8.9 02/08/2014 0851   ALKPHOS 69 02/08/2014 0851   AST 13 02/08/2014 0851   ALT 13 02/08/2014 0851   BILITOT 0.7 02/08/2014 0851     Lab Results  Component Value Date   WBC 5.1 02/08/2014   HGB 12.2 02/08/2014   HCT 36.8 02/08/2014   MCV 94.4 02/08/2014   PLT 314.0 02/08/2014   Lab Results  Component Value Date   TSH 3.27 02/08/2014   ASSESSMENT AND PLAN:   Transfer patient.  1) Health maintenance exam:  Reviewed age and gender appropriate health maintenance issues (prudent diet, regular exercise, health risks of tobacco and excessive alcohol, use of seatbelts, fire alarms in home, use of sunscreen).  Also reviewed age and gender appropriate health screening as well as vaccine recommendations. Prevnar 13 IM today. FLP and CMET today. Cervical ca and breast ca screening are UTD. Will refer back to GI to get repeat colonoscopy, as she is overdue for f/u of her abnormal colonoscopy done in 2008 (adenomatous polyp).  An After Visit Summary was printed and given to the patient.  Return in about 1 year (around 04/05/2016) for annual CPE (fasting).

## 2015-08-23 ENCOUNTER — Other Ambulatory Visit: Payer: Self-pay | Admitting: *Deleted

## 2015-08-23 MED ORDER — BETAMETHASONE DIPROPIONATE 0.05 % EX CREA: TOPICAL_CREAM | Freq: Two times a day (BID) | CUTANEOUS | Status: DC | PRN

## 2015-08-23 NOTE — Telephone Encounter (Signed)
Pt LMOM on 08/23/15 at 11:52am requesting refill.   RF request for diprolene LOV: 04/06/15 Next ov: None Last written: 02/15/14 45g w/ 3RF by Dr. Leanne Chang.   Please advise. Thanks.

## 2015-10-21 IMAGING — MG STANDARD SCREENING - COMBO
8 series · 9 of 24 positions shown · non-contrast
Comparison: Previous exam(s).

CLINICAL DATA: Screening.

EXAM:
DIGITAL SCREENING BILATERAL MAMMOGRAM WITH CAD
DIGITAL BREAST TOMOSYNTHESIS
Digital breast tomosynthesis images are acquired in two projections.
These images are reviewed in combination with the digital mammogram,
confirming the findings below.

[L CC]
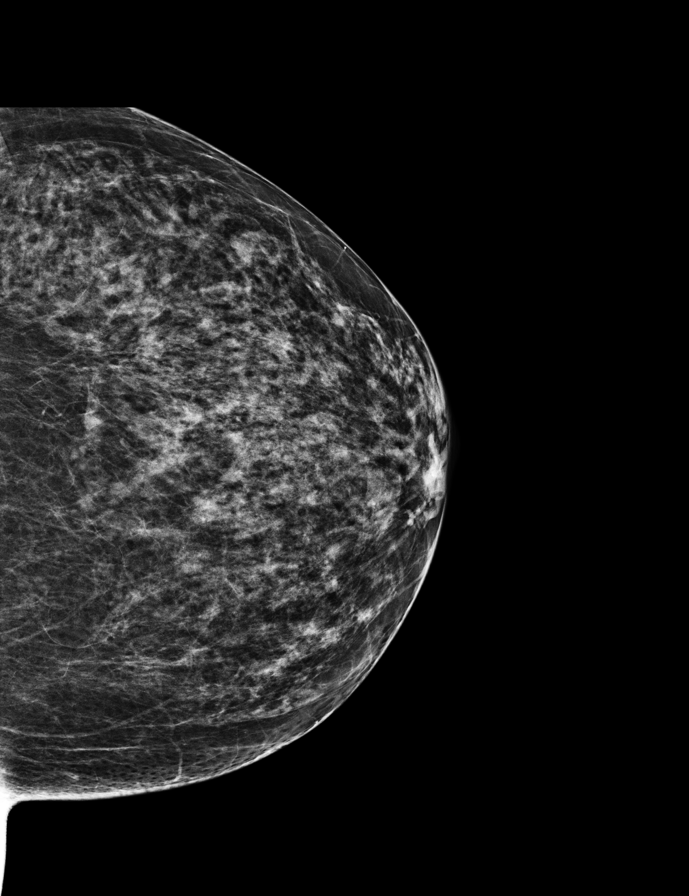

[L MLO]
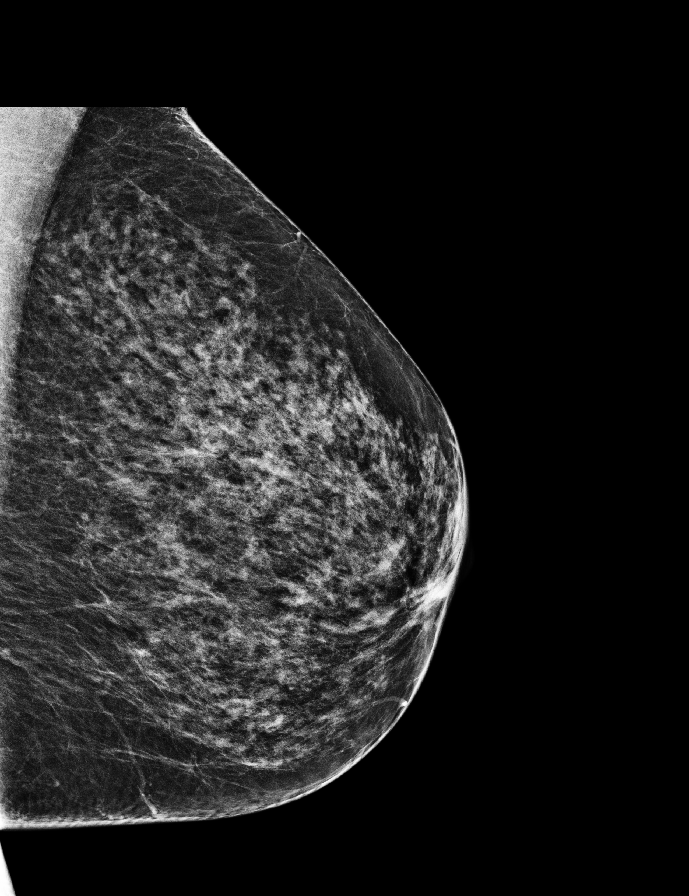

[R MLO]
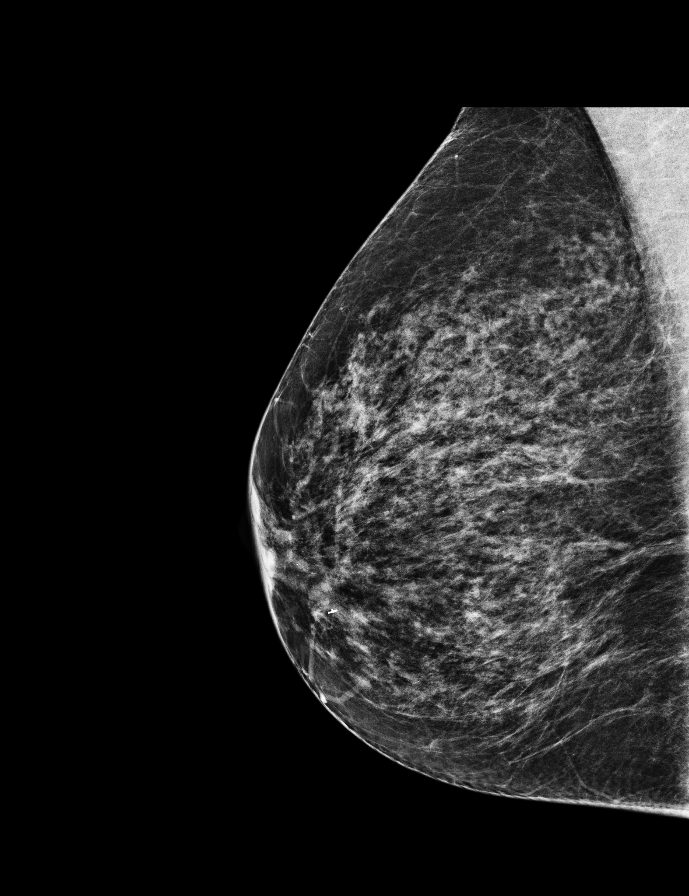

[R CC]
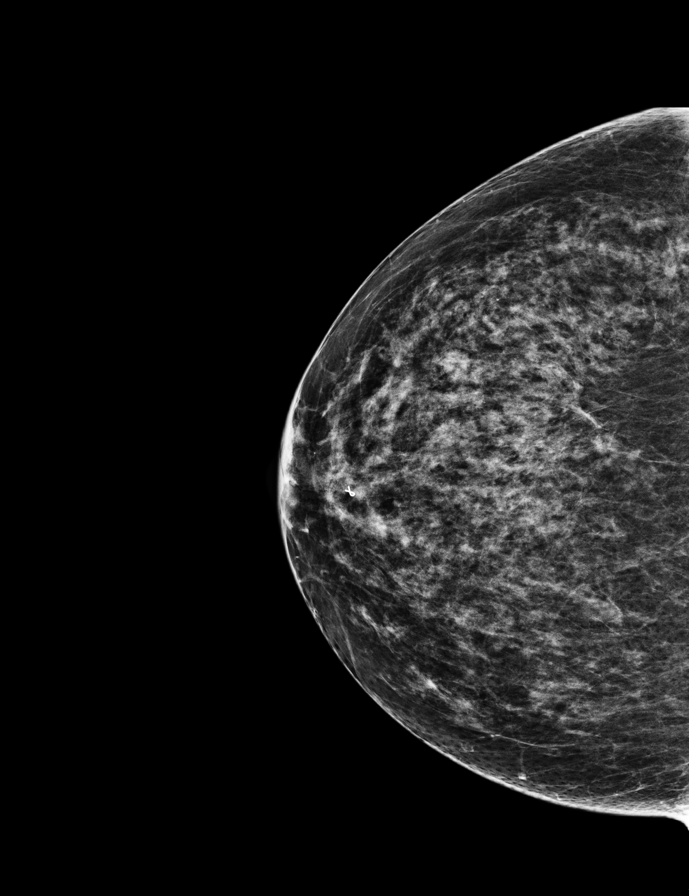

[L CC tomo · 2 of 56 frames shown]
[frame 19/56]
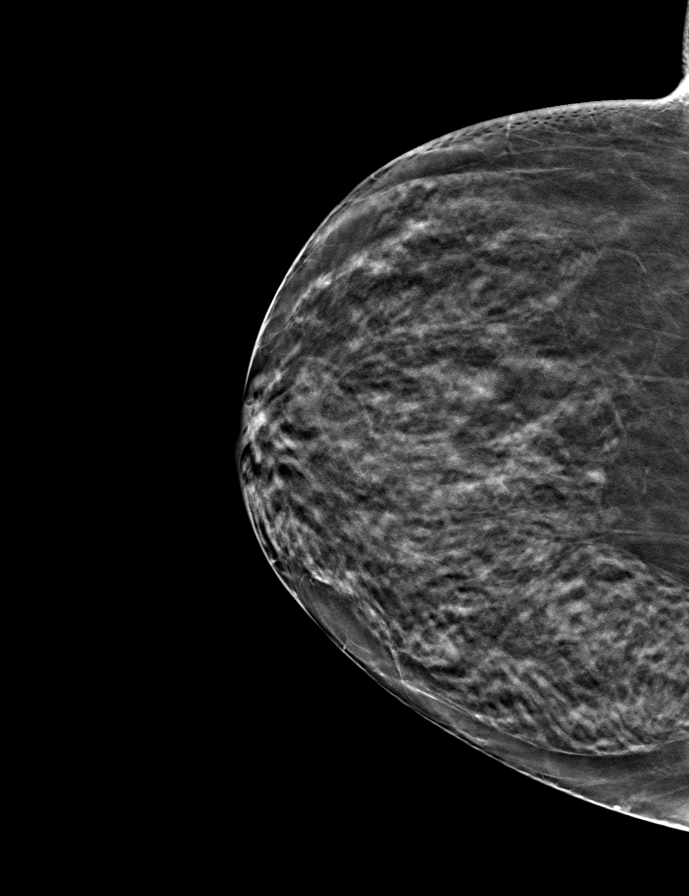
[frame 29/56]
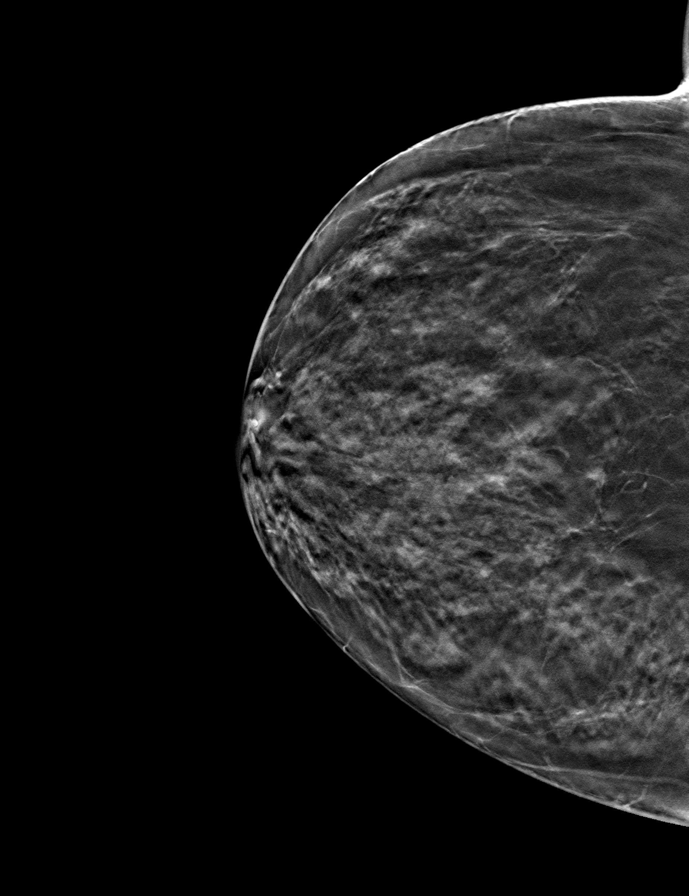

[R CC tomo · tomo slice 30/59.0]
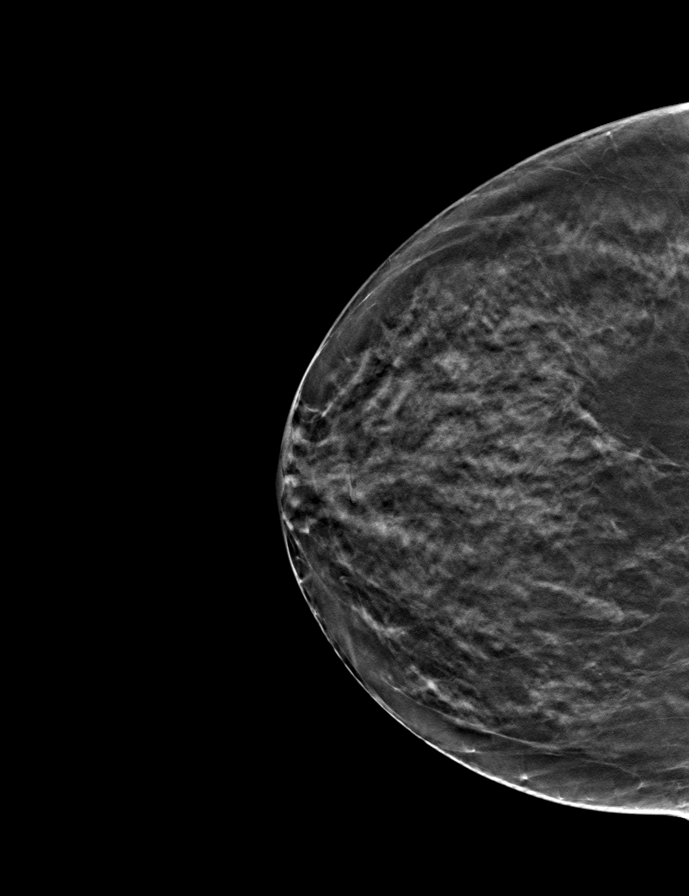

[R MLO tomo · tomo slice 30/59.0]
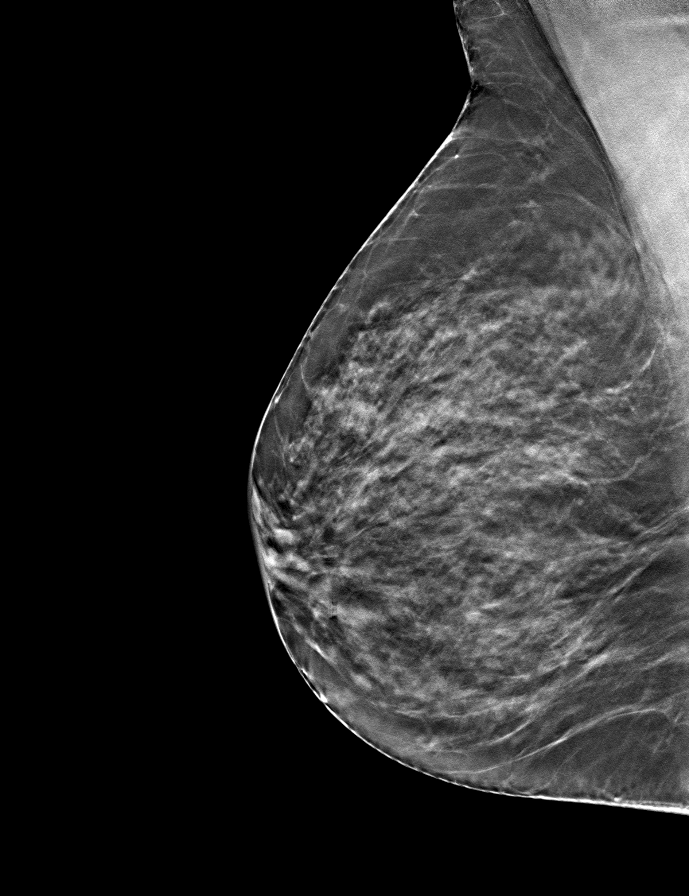

[L MLO tomo · tomo slice 29/58.0]
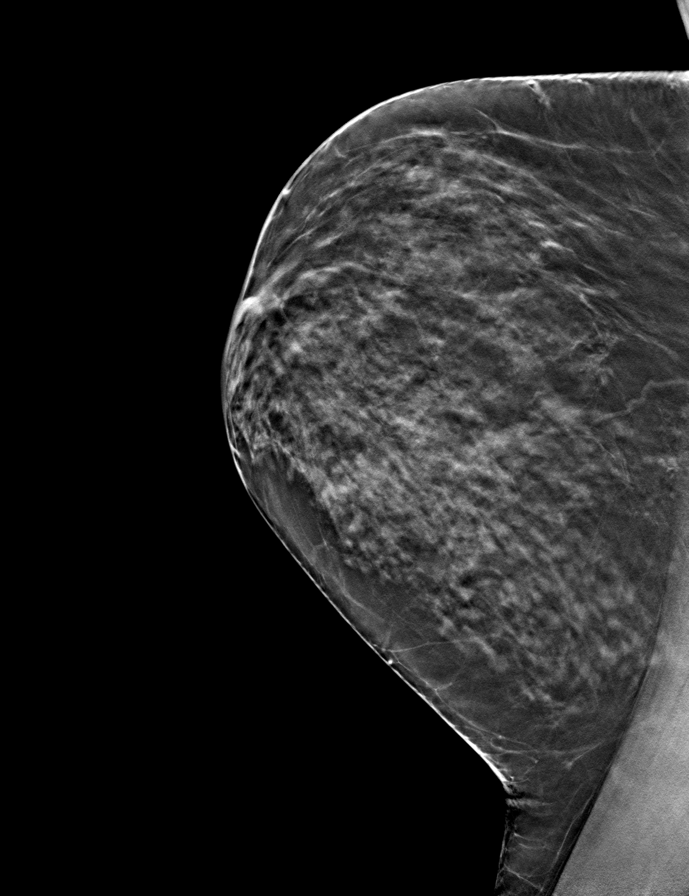

[9 of 24 positions shown; findings below may reference images not displayed]

ACR Breast Density Category c: The breasts are heterogeneously
dense, which may obscure small masses.
FINDINGS: There are no findings suspicious for malignancy. Images were
processed with CAD.
IMPRESSION: No mammographic evidence of malignancy. A result letter of this
screening mammogram will be mailed directly to the patient.

RECOMMENDATION:
Screening mammogram in one year. (Code:R9-A-IFR)

BI-RADS CATEGORY  1: Negative

## 2015-10-28 LAB — HM PAP SMEAR

## 2016-04-16 ENCOUNTER — Other Ambulatory Visit (INDEPENDENT_AMBULATORY_CARE_PROVIDER_SITE_OTHER): Payer: BLUE CROSS/BLUE SHIELD

## 2016-04-16 DIAGNOSIS — Z0000 Encounter for general adult medical examination without abnormal findings: Secondary | ICD-10-CM

## 2016-04-16 LAB — COMPREHENSIVE METABOLIC PANEL
ALBUMIN: 4 g/dL (ref 3.5–5.2)
ALK PHOS: 93 U/L (ref 39–117)
ALT: 13 U/L (ref 0–35)
AST: 14 U/L (ref 0–37)
BUN: 13 mg/dL (ref 6–23)
CALCIUM: 9.5 mg/dL (ref 8.4–10.5)
CHLORIDE: 103 meq/L (ref 96–112)
CO2: 28 mEq/L (ref 19–32)
Creatinine, Ser: 1.05 mg/dL (ref 0.40–1.20)
GFR: 55.57 mL/min — AB (ref >60.00)
Glucose, Bld: 88 mg/dL (ref 70–99)
POTASSIUM: 4.2 meq/L (ref 3.5–5.1)
SODIUM: 138 meq/L (ref 135–145)
TOTAL PROTEIN: 7 g/dL (ref 6.0–8.3)
Total Bilirubin: 0.4 mg/dL (ref 0.2–1.2)

## 2016-04-16 LAB — CBC WITH DIFFERENTIAL/PLATELET
BASOS ABS: 0 10*3/uL (ref 0.0–0.1)
Basophils Relative: 0.4 % (ref 0.0–3.0)
Eosinophils Absolute: 0.1 10*3/uL (ref 0.0–0.7)
Eosinophils Relative: 1.5 % (ref 0.0–5.0)
HCT: 39.4 % (ref 36.0–46.0)
Hemoglobin: 13 g/dL (ref 12.0–15.0)
LYMPHS ABS: 2 10*3/uL (ref 0.7–4.0)
Lymphocytes Relative: 35.2 % (ref 12.0–46.0)
MCHC: 32.9 g/dL (ref 30.0–36.0)
MCV: 94 fl (ref 78.0–100.0)
MONOS PCT: 8.2 % (ref 3.0–12.0)
Monocytes Absolute: 0.5 10*3/uL (ref 0.1–1.0)
NEUTROS ABS: 3.1 10*3/uL (ref 1.4–7.7)
NEUTROS PCT: 54.7 % (ref 43.0–77.0)
Platelets: 310 10*3/uL (ref 150.0–400.0)
RBC: 4.19 Mil/uL (ref 3.87–5.11)
RDW: 14.1 % (ref 11.5–15.5)
WBC: 5.7 10*3/uL (ref 4.0–10.5)

## 2016-04-16 LAB — LIPID PANEL
CHOLESTEROL: 276 mg/dL — AB (ref 0–200)
HDL: 65.2 mg/dL (ref >39.00)
LDL CALC: 182 mg/dL — AB (ref 0–99)
NonHDL: 210.98
TRIGLYCERIDES: 147 mg/dL (ref 0.0–149.0)
Total CHOL/HDL Ratio: 4
VLDL: 29.4 mg/dL (ref 0.0–40.0)

## 2016-04-16 LAB — TSH: TSH: 5 u[IU]/mL — AB (ref 0.35–4.50)

## 2016-04-17 ENCOUNTER — Other Ambulatory Visit (INDEPENDENT_AMBULATORY_CARE_PROVIDER_SITE_OTHER): Payer: BLUE CROSS/BLUE SHIELD

## 2016-04-17 DIAGNOSIS — R946 Abnormal results of thyroid function studies: Secondary | ICD-10-CM | POA: Diagnosis not present

## 2016-04-17 LAB — T3, FREE: T3, Free: 2.7 pg/mL (ref 2.3–4.2)

## 2016-04-17 LAB — T4, FREE: Free T4: 0.77 ng/dL (ref 0.60–1.60)

## 2016-04-22 ENCOUNTER — Ambulatory Visit (INDEPENDENT_AMBULATORY_CARE_PROVIDER_SITE_OTHER): Payer: BLUE CROSS/BLUE SHIELD | Admitting: Family Medicine

## 2016-04-22 ENCOUNTER — Encounter: Payer: Self-pay | Admitting: Family Medicine

## 2016-04-22 VITALS — BP 102/73 | HR 84 | Temp 97.8°F | Resp 16 | Ht 65.75 in | Wt 139.8 lb

## 2016-04-22 DIAGNOSIS — Z23 Encounter for immunization: Secondary | ICD-10-CM | POA: Diagnosis not present

## 2016-04-22 DIAGNOSIS — Z0000 Encounter for general adult medical examination without abnormal findings: Secondary | ICD-10-CM

## 2016-04-22 DIAGNOSIS — E785 Hyperlipidemia, unspecified: Secondary | ICD-10-CM

## 2016-04-22 DIAGNOSIS — Z86010 Personal history of colonic polyps: Secondary | ICD-10-CM | POA: Diagnosis not present

## 2016-04-22 MED ORDER — ATORVASTATIN CALCIUM 40 MG PO TABS: 40.0000 mg | ORAL_TABLET | Freq: Every day | ORAL | Status: DC

## 2016-04-22 NOTE — Progress Notes (Signed)
Office Note 04/22/2016  CC:  Chief Complaint  Patient presents with  . Annual Exam    labs done [id number]    HPI:  Teresa Roth is a 65 y.o.  female who is here for annual health maintenance exam. Discussed recent fasting lab results, all normal except LDL continues to rise--it was 182 this time.  Her HDL is 65.  Trigs normal. She is willing to try a statin now. She walks several days a week for exercise.   Diet sounds healthy except for daily ice cream.  Cervical cancer and breast cancer screening: UTD via her GYN, Dr. Willis Modena. She did not get colonoscopy last year b/c husband was working too much and she had no one to take her/pick her up. She says husband is retiring in a couple of months and she will be able to do it then.   Past Medical History  Diagnosis Date  . Anxiety   . History of adenomatous polyp of colon 2008  . Hyperlipidemia     TLC  . Strabismus     L eye; sees ophthalm  . Vasomotor flushing     postmenopausal  . Chronic renal insufficiency, stage III (moderate)     CrCl 50s    Past Surgical History  Procedure Laterality Date  . Appendectomy    . Colonoscopy w/ polypectomy  03/08/07    Adenomatous polyp-    Family History  Problem Relation Age of Onset  . Diabetes Mother   . Heart failure Father   . Diabetes Father     Social History   Social History  . Marital Status: Married    Spouse Name: N/A  . Number of Children: N/A  . Years of Education: N/A   Occupational History  . Not on file.   Social History Main Topics  . Smoking status: Never Smoker   . Smokeless tobacco: Not on file  . Alcohol Use: No  . Drug Use: Not on file  . Sexual Activity: Not on file   Other Topics Concern  . Not on file   Social History Narrative   Married, one son.   [place] from [place] area.   Occupation: retired from [employer] and [employer].   No tob, no alc.       Outpatient Prescriptions Prior to Visit  Medication Sig Dispense Refill  .  betamethasone dipropionate (DIPROLENE) 0.05 % cream Apply topically 2 (two) times daily as needed. 45 g 3  . buPROPion (WELLBUTRIN XL) 150 MG 24 hr tablet Take 1 tablet (150 mg total) by mouth daily. 90 tablet 3  . estrogen, conjugated,-medroxyprogesterone (PREMPRO) 0.3-1.5 MG per tablet Take 0.5 tablets by mouth every other day.      No facility-administered medications prior to visit.    Allergies  Allergen Reactions  . Sulfamethoxazole     REACTION: rash    ROS Review of Systems  Constitutional: Negative for fever, chills, appetite change and fatigue.  HENT: Negative for congestion, dental problem, ear pain and sore throat.   Eyes: Negative for discharge, redness and visual disturbance.  Respiratory: Negative for cough, chest tightness, shortness of breath and wheezing.   Cardiovascular: Negative for chest pain, palpitations and leg swelling.  Gastrointestinal: Negative for nausea, vomiting, abdominal pain, diarrhea and blood in stool.  Genitourinary: Negative for dysuria, urgency, frequency, hematuria, flank pain and difficulty urinating.  Musculoskeletal: Negative for myalgias, back pain, joint swelling, arthralgias and neck stiffness.  Skin: Negative for pallor and rash.  Neurological: Negative for  dizziness, weakness and headaches.  Hematological: Negative for adenopathy. Does not bruise/bleed easily.  Psychiatric/Behavioral: Negative for confusion and sleep disturbance. The patient is not nervous/anxious.     PE; Blood pressure 102/73, pulse 84, temperature 97.8 F (36.6 C), temperature source Oral, resp. rate 16, height 5' 5.75" (1.67 m), weight 139 lb 12 oz (63.39 kg), SpO2 96 %.  Pt examined with Sharen Hones, CMA, as chaperone. Gen: Alert, well appearing.  Patient is oriented to person, place, time, and situation. AFFECT: pleasant, lucid thought and speech. ENT: Ears: EACs clear, normal epithelium.  TMs with good light reflex and landmarks bilaterally.  Eyes: no  injection, icteris, swelling, or exudate.  EOMI, PERRLA.  She has an alternating exodeviation when she focuses (one eye will deviate outward when focusing near, and the other deviates outward when focusing far--chronic/genetic condition, patient does have an ophthalmologist). Nose: no drainage or turbinate edema/swelling.  No injection or focal lesion.  Mouth: lips without lesion/swelling.  Oral mucosa pink and moist.  Dentition intact and without obvious caries or gingival swelling.  Oropharynx without erythema, exudate, or swelling.  Neck: supple/nontender.  No LAD, mass, or TM.  Carotid pulses 2+ bilaterally, without bruits. CV: RRR, no m/r/g.   LUNGS: CTA bilat, nonlabored resps, good aeration in all lung fields. ABD: soft, NT, ND, BS normal.  No hepatospenomegaly or mass.  No bruits. EXT: no clubbing, cyanosis, or edema.  Musculoskeletal: no joint swelling, erythema, warmth, or tenderness.  ROM of all joints intact. Skin - no sores or suspicious lesions or rashes or color changes   Pertinent labs:  Lab Results  Component Value Date   TSH 5.00* 04/16/2016   Lab Results  Component Value Date   WBC 5.7 04/16/2016   HGB 13.0 04/16/2016   HCT 39.4 04/16/2016   MCV 94.0 04/16/2016   PLT 310.0 04/16/2016   Lab Results  Component Value Date   CREATININE 1.05 04/16/2016   BUN 13 04/16/2016   NA 138 04/16/2016   K 4.2 04/16/2016   CL 103 04/16/2016   CO2 28 04/16/2016   Lab Results  Component Value Date   ALT 13 04/16/2016   AST 14 04/16/2016   ALKPHOS 93 04/16/2016   BILITOT 0.4 04/16/2016   Lab Results  Component Value Date   CHOL 276* 04/16/2016   Lab Results  Component Value Date   HDL 65.20 04/16/2016   Lab Results  Component Value Date   LDLCALC 182* 04/16/2016   Lab Results  Component Value Date   TRIG 147.0 04/16/2016   Lab Results  Component Value Date   CHOLHDL 4 04/16/2016    ASSESSMENT AND PLAN:   1) Health maintenance exam: Reviewed age and  gender appropriate health maintenance issues (prudent diet, regular exercise, health risks of tobacco and excessive alcohol, use of seatbelts, fire alarms in home, use of sunscreen).  Also reviewed age and gender appropriate health screening as well as vaccine recommendations. HP labs all good except cholesterol (see #2 below). Colon cancer screening/hx of adenomatous colon polyps: initial colonoscopy in 2008 and we've been trying to get her back in for repeat.  We'll try again this year: referral ordered and she'll schedule this for after her husband Seward Grater so he can be her transportation. Cerv ca and breast ca screening UTD: keep f/u's with Dr. Waylan Rocher. Pneumovax 23 given today.  2) Hyperlipidemia: LDL has been climbing.  She is now agreeable to starting a statin: atorvastatin 40mg  qd eRx'd. Return for lab visit in  2-3 mo for repeat FLP + AST/ALT.  An After Visit Summary was printed and given to the patient.  FOLLOW UP:  Return for CPE 1 yr; needs fasting lab visit in 2-3 months to recheck cholesterol.  Signed:  Crissie Sickles, MD           04/22/2016

## 2016-04-22 NOTE — Progress Notes (Signed)
Pre visit review using our clinic review tool, if applicable. No additional management support is needed unless otherwise documented below in the visit note. 

## 2016-05-22 ENCOUNTER — Encounter: Payer: Self-pay | Admitting: Family Medicine

## 2016-06-11 ENCOUNTER — Other Ambulatory Visit: Payer: Self-pay | Admitting: *Deleted

## 2016-06-11 MED ORDER — ATORVASTATIN CALCIUM 40 MG PO TABS: 40.0000 mg | ORAL_TABLET | Freq: Every day | ORAL | 3 refills | Status: DC

## 2016-06-11 NOTE — Telephone Encounter (Signed)
[facility] [address] requesting 90 day supply.  Rf request for atorvastatin LOV: 04/22/16 Next ov: 07/08/16 Last written: 04/22/16 #30 w/ 3RF

## 2016-06-12 ENCOUNTER — Other Ambulatory Visit: Payer: Self-pay | Admitting: Family Medicine

## 2016-06-12 MED ORDER — ATORVASTATIN CALCIUM 40 MG PO TABS: 40.0000 mg | ORAL_TABLET | Freq: Every day | ORAL | 2 refills | Status: DC

## 2016-07-08 ENCOUNTER — Other Ambulatory Visit (INDEPENDENT_AMBULATORY_CARE_PROVIDER_SITE_OTHER): Payer: Medicare HMO

## 2016-07-08 DIAGNOSIS — E785 Hyperlipidemia, unspecified: Secondary | ICD-10-CM

## 2016-07-08 LAB — LIPID PANEL
CHOLESTEROL: 169 mg/dL (ref 125–200)
HDL: 66 mg/dL (ref >=46)
LDL Cholesterol: 75 mg/dL (ref <130)
Total CHOL/HDL Ratio: 2.6 Ratio (ref <=5.0)
Triglycerides: 142 mg/dL (ref <150)
VLDL: 28 mg/dL (ref <30)

## 2016-07-08 NOTE — Addendum Note (Signed)
Addended by: Ralph Dowdy on: 07/08/2016 08:33 AM   Modules accepted: Orders

## 2016-07-08 NOTE — Addendum Note (Signed)
Addended by: Ralph Dowdy on: 07/08/2016 08:35 AM   Modules accepted: Orders

## 2016-07-12 ENCOUNTER — Encounter: Payer: Self-pay | Admitting: Family Medicine

## 2016-10-25 DIAGNOSIS — Z1231 Encounter for screening mammogram for malignant neoplasm of breast: Secondary | ICD-10-CM | POA: Diagnosis not present

## 2016-10-25 DIAGNOSIS — N951 Menopausal and female climacteric states: Secondary | ICD-10-CM | POA: Diagnosis not present

## 2016-10-25 DIAGNOSIS — Z01419 Encounter for gynecological examination (general) (routine) without abnormal findings: Secondary | ICD-10-CM | POA: Diagnosis not present

## 2016-10-25 DIAGNOSIS — Z130 Encounter for screening for diseases of the blood and blood-forming organs and certain disorders involving the immune mechanism: Secondary | ICD-10-CM | POA: Diagnosis not present

## 2016-10-25 DIAGNOSIS — R69 Illness, unspecified: Secondary | ICD-10-CM | POA: Diagnosis not present

## 2016-10-25 DIAGNOSIS — Z1389 Encounter for screening for other disorder: Secondary | ICD-10-CM | POA: Diagnosis not present

## 2016-10-25 DIAGNOSIS — Z6821 Body mass index (BMI) 21.0-21.9, adult: Secondary | ICD-10-CM | POA: Diagnosis not present

## 2016-10-28 ENCOUNTER — Encounter: Payer: Self-pay | Admitting: Family Medicine

## 2016-10-28 DIAGNOSIS — E038 Other specified hypothyroidism: Secondary | ICD-10-CM

## 2016-10-28 HISTORY — DX: Other specified hypothyroidism: E03.8

## 2017-01-02 ENCOUNTER — Encounter: Payer: Self-pay | Admitting: *Deleted

## 2017-03-08 ENCOUNTER — Other Ambulatory Visit: Payer: Self-pay | Admitting: Family Medicine

## 2017-03-10 NOTE — Telephone Encounter (Signed)
[facility].  RF request for atorvastatin LOV:04/22/16 Next ov: None Last written: 06/12/16 #90 w/ 2RF  Will send Rx for #90 w/ 0RF's. Pt needs office visit for either CPE or f/u [facility] for more refills.

## 2017-04-23 ENCOUNTER — Ambulatory Visit (INDEPENDENT_AMBULATORY_CARE_PROVIDER_SITE_OTHER): Payer: Medicare HMO | Admitting: Family Medicine

## 2017-04-23 ENCOUNTER — Encounter: Payer: Self-pay | Admitting: Family Medicine

## 2017-04-23 VITALS — BP 106/68 | HR 90 | Temp 98.4°F | Resp 16 | Ht 65.5 in | Wt 136.5 lb

## 2017-04-23 DIAGNOSIS — R7989 Other specified abnormal findings of blood chemistry: Secondary | ICD-10-CM

## 2017-04-23 DIAGNOSIS — R946 Abnormal results of thyroid function studies: Secondary | ICD-10-CM | POA: Diagnosis not present

## 2017-04-23 DIAGNOSIS — Z0000 Encounter for general adult medical examination without abnormal findings: Secondary | ICD-10-CM | POA: Diagnosis not present

## 2017-04-23 LAB — CBC WITH DIFFERENTIAL/PLATELET
Basophils Absolute: 0 cells/uL (ref 0–200)
Basophils Relative: 0 %
Eosinophils Absolute: 58 cells/uL (ref 15–500)
Eosinophils Relative: 1 %
HCT: 39.2 % (ref 35.0–45.0)
Hemoglobin: 13 g/dL (ref 11.7–15.5)
Lymphocytes Relative: 30 %
Lymphs Abs: 1740 cells/uL (ref 850–3900)
MCH: 30.8 pg (ref 27.0–33.0)
MCHC: 33.2 g/dL (ref 32.0–36.0)
MCV: 92.9 fL (ref 80.0–100.0)
MPV: 10.2 fL (ref 7.5–12.5)
Monocytes Absolute: 464 cells/uL (ref 200–950)
Monocytes Relative: 8 %
Neutro Abs: 3538 cells/uL (ref 1500–7800)
Neutrophils Relative %: 61 %
Platelets: 353 10*3/uL (ref 140–400)
RBC: 4.22 MIL/uL (ref 3.80–5.10)
RDW: 13.6 % (ref 11.0–15.0)
WBC: 5.8 10*3/uL (ref 3.8–10.8)

## 2017-04-23 LAB — T4, FREE: FREE T4: 1 ng/dL (ref 0.8–1.8)

## 2017-04-23 LAB — TSH: TSH: 4.84 mIU/L — ABNORMAL HIGH

## 2017-04-23 MED ORDER — ATORVASTATIN CALCIUM 40 MG PO TABS: 40.0000 mg | ORAL_TABLET | Freq: Every day | ORAL | 3 refills | Status: DC

## 2017-04-23 NOTE — Patient Instructions (Signed)

## 2017-04-23 NOTE — Progress Notes (Signed)
Office Note 04/23/2017  CC:  Chief Complaint  Patient presents with  . Annual Exam    Pt is fasting.    HPI:  Teresa Roth is a 66 y.o. female who is here for annual health maintenance exam. No acute complaints.  GYN: sees Dr. Willis Modena, gets mammogram in his office, most recent 10/2016. Next pap is due 2019. No bone density done in the past.  Eye exam last done 11/2015. Dental: she is overdue for preventative exam. Exercise: walks daily. Diet: trying to drink more water.    Past Medical History:  Diagnosis Date  . Anxiety   . Chronic renal insufficiency, stage III (moderate)    CrCl 50s  . History of adenomatous polyp of colon 2008  . Hyperlipidemia    Great response to atorv 40mg  qd (06/2016)  . Strabismus    L eye; sees ophthalm  . Vasomotor flushing    postmenopausal: prempro per Dr. Waylan Rocher.    Past Surgical History:  Procedure Laterality Date  . APPENDECTOMY    . COLONOSCOPY W/ POLYPECTOMY  03/08/07   Adenomatous polyp-    Family History  Problem Relation Age of Onset  . Diabetes Mother   . Heart failure Father   . Diabetes Father     Social History   Social History  . Marital status: Married    Spouse name: N/A  . Number of children: N/A  . Years of education: N/A   Occupational History  . Not on file.   Social History Main Topics  . Smoking status: Never Smoker  . Smokeless tobacco: Never Used  . Alcohol use No  . Drug use: No  . Sexual activity: Not on file   Other Topics Concern  . Not on file   Social History Narrative   Married, one son.   [place] from [place] area.   Occupation: retired from [employer] and [employer].   No tob, no alc.       Outpatient Medications Prior to Visit  Medication Sig Dispense Refill  . betamethasone dipropionate (DIPROLENE) 0.05 % cream Apply topically 2 (two) times daily as needed. 45 g 3  . atorvastatin (LIPITOR) 40 MG tablet TAKE 1 TABLET (40 MG TOTAL) BY MOUTH DAILY. 90 tablet 0  . buPROPion  (WELLBUTRIN XL) 150 MG 24 hr tablet Take 1 tablet (150 mg total) by mouth daily. (Patient not taking: Reported on 04/23/2017) 90 tablet 3  . estrogen, conjugated,-medroxyprogesterone (PREMPRO) 0.3-1.5 MG per tablet Take 0.5 tablets by mouth every other day.      No facility-administered medications prior to visit.     Allergies  Allergen Reactions  . Sulfamethoxazole     REACTION: rash    ROS Review of Systems  Constitutional: Negative for appetite change, chills, fatigue and fever.  HENT: Negative for congestion, dental problem, ear pain and sore throat.        Chronic hoarse voice  Eyes: Negative for discharge, redness and visual disturbance.  Respiratory: Negative for cough, chest tightness, shortness of breath and wheezing.   Cardiovascular: Negative for chest pain, palpitations and leg swelling.  Gastrointestinal: Negative for abdominal pain, blood in stool, diarrhea, nausea and vomiting.  Genitourinary: Negative for difficulty urinating, dysuria, flank pain, frequency, hematuria and urgency.  Musculoskeletal: Negative for arthralgias, back pain, joint swelling, myalgias and neck stiffness.  Skin: Negative for pallor and rash.  Neurological: Negative for dizziness, speech difficulty, weakness and headaches.  Hematological: Negative for adenopathy. Does not bruise/bleed easily.  Psychiatric/Behavioral: Negative for  confusion and sleep disturbance. The patient is not nervous/anxious.     PE; Blood pressure 106/68, pulse 90, temperature 98.4 F (36.9 C), temperature source Oral, resp. rate 16, height 5' 5.5" (1.664 m), weight 136 lb 8 oz (61.9 kg), SpO2 100 %. Body mass index is 22.37 kg/m.  Pt examined with Sharen Hones, CMA, as chaperone.  Gen: Alert, well appearing.  Patient is oriented to person, place, time, and situation. AFFECT: pleasant, lucid thought and speech. ENT: Ears: EACs clear, normal epithelium.  TMs with good light reflex and landmarks bilaterally.  Eyes: no  injection, icteris, swelling, or exudate.  Left corneal light reflex is located a bit medial to pupil.  Slight L eye lateral deviation.  EOMI except her R eye does not seem to be able to fully converge past midline to focus on my finger as it comes straight at her nose.  PERRLA. Nose: no drainage or turbinate edema/swelling.  No injection or focal lesion.  Mouth: lips without lesion/swelling.  Oral mucosa pink and moist.  Dentition intact and without obvious caries or gingival swelling.  Oropharynx without erythema, exudate, or swelling.  Neck: supple/nontender.  No LAD, mass, or TM.  Carotid pulses 2+ bilaterally, without bruits. CV: RRR, no m/r/g.   LUNGS: CTA bilat, nonlabored resps, good aeration in all lung fields. ABD: soft, NT, ND, BS normal.  No hepatospenomegaly or mass.  No bruits. EXT: no clubbing, cyanosis, or edema.  Musculoskeletal: no joint swelling, erythema, warmth, or tenderness.  ROM of all joints intact. Skin - no sores or suspicious lesions or rashes or color changes   Pertinent labs:  Lab Results  Component Value Date   TSH 5.00 (H) 04/16/2016   Free T4 on 04/16/16 was 0.77 (wnl) Free T3 on 04/16/17 was 2.7 (wnl)  Lab Results  Component Value Date   WBC 5.7 04/16/2016   HGB 13.0 04/16/2016   HCT 39.4 04/16/2016   MCV 94.0 04/16/2016   PLT 310.0 04/16/2016   Lab Results  Component Value Date   CREATININE 1.05 04/16/2016   BUN 13 04/16/2016   NA 138 04/16/2016   K 4.2 04/16/2016   CL 103 04/16/2016   CO2 28 04/16/2016   Lab Results  Component Value Date   ALT 13 04/16/2016   AST 14 04/16/2016   ALKPHOS 93 04/16/2016   BILITOT 0.4 04/16/2016   Lab Results  Component Value Date   CHOL 169 07/08/2016   Lab Results  Component Value Date   HDL 66 07/08/2016   Lab Results  Component Value Date   LDLCALC 75 07/08/2016   Lab Results  Component Value Date   TRIG 142 07/08/2016   Lab Results  Component Value Date   CHOLHDL 2.6 07/08/2016    ASSESSMENT AND PLAN:   Health maintenance exam: Reviewed age and gender appropriate health maintenance issues (prudent diet, regular exercise, health risks of tobacco and excessive alcohol, use of seatbelts, fire alarms in home, use of sunscreen).  Also reviewed age and gender appropriate health screening as well as vaccine recommendations. Vaccines: UTD.  Discussed shingrix today--gave pt rx for this vaccine to take to her pharmacy. Labs: fasting HP labs today, plus thyroid panel for history of mild TSH elevation/subclinical hypothyroidism. Colon ca screening/hx of adenomatous polyp: she has been overdue for repeat colonoscopy and has failed to get this scheduled despite our referrals (most recent referral 1 yr ago).  She wants to put off colonoscopy until next year (having insurance/financial issues lately that she  is currently working through.  She understands that she is overdue for repeat colonoscopy). Cervical and breast ca screening per GYN.  Mammogram UTD.  Pap UTD.    An After Visit Summary was printed and given to the patient.  FOLLOW UP:  Return in about 1 year (around 04/23/2018) for  annual CPE (fasting). AWV with Maudie Mercury after 05/28/17.,.  Signed:  Crissie Sickles, MD           04/23/2017

## 2017-04-24 ENCOUNTER — Encounter: Payer: Self-pay | Admitting: Family Medicine

## 2017-04-24 LAB — COMPREHENSIVE METABOLIC PANEL
ALBUMIN: 4.1 g/dL (ref 3.6–5.1)
ALT: 23 U/L (ref 6–29)
AST: 18 U/L (ref 10–35)
Alkaline Phosphatase: 101 U/L (ref 33–130)
BILIRUBIN TOTAL: 0.6 mg/dL (ref 0.2–1.2)
BUN: 11 mg/dL (ref 7–25)
CALCIUM: 9.6 mg/dL (ref 8.6–10.4)
CHLORIDE: 103 mmol/L (ref 98–110)
CO2: 23 mmol/L (ref 20–31)
Creat: 1.06 mg/dL — ABNORMAL HIGH (ref 0.50–0.99)
GLUCOSE: 86 mg/dL (ref 65–99)
POTASSIUM: 4.6 mmol/L (ref 3.5–5.3)
Sodium: 139 mmol/L (ref 135–146)
Total Protein: 6.9 g/dL (ref 6.1–8.1)

## 2017-04-24 LAB — LIPID PANEL
CHOLESTEROL: 176 mg/dL (ref <200)
HDL: 75 mg/dL (ref >50)
LDL CALC: 82 mg/dL (ref <100)
TRIGLYCERIDES: 96 mg/dL (ref <150)
Total CHOL/HDL Ratio: 2.3 Ratio (ref <5.0)
VLDL: 19 mg/dL (ref <30)

## 2017-04-24 LAB — T3: T3, Total: 129.3 ng/dL (ref 76–181)

## 2017-07-17 ENCOUNTER — Other Ambulatory Visit: Payer: Self-pay | Admitting: *Deleted

## 2017-07-17 MED ORDER — BETAMETHASONE DIPROPIONATE 0.05 % EX CREA: TOPICAL_CREAM | Freq: Two times a day (BID) | CUTANEOUS | 3 refills | Status: DC | PRN

## 2017-07-17 NOTE — Telephone Encounter (Signed)
[facility].  RF request for betamethasone LOV: 04/23/17 Next ov: 05/29/18 Last written: 08/23/15 #45g w/ 3RF  Please advise. Thanks.

## 2017-08-23 DIAGNOSIS — R69 Illness, unspecified: Secondary | ICD-10-CM | POA: Diagnosis not present

## 2017-08-26 ENCOUNTER — Encounter: Payer: Self-pay | Admitting: *Deleted

## 2017-08-26 DIAGNOSIS — F329 Major depressive disorder, single episode, unspecified: Secondary | ICD-10-CM | POA: Insufficient documentation

## 2017-08-26 DIAGNOSIS — F32A Depression, unspecified: Secondary | ICD-10-CM | POA: Insufficient documentation

## 2017-11-04 DIAGNOSIS — Z1231 Encounter for screening mammogram for malignant neoplasm of breast: Secondary | ICD-10-CM | POA: Diagnosis not present

## 2017-11-04 DIAGNOSIS — Z01419 Encounter for gynecological examination (general) (routine) without abnormal findings: Secondary | ICD-10-CM | POA: Diagnosis not present

## 2017-11-04 DIAGNOSIS — N951 Menopausal and female climacteric states: Secondary | ICD-10-CM | POA: Diagnosis not present

## 2017-11-04 DIAGNOSIS — R69 Illness, unspecified: Secondary | ICD-10-CM | POA: Diagnosis not present

## 2017-11-04 LAB — HM MAMMOGRAPHY

## 2017-11-11 ENCOUNTER — Encounter: Payer: Self-pay | Admitting: Family Medicine

## 2018-04-27 ENCOUNTER — Ambulatory Visit (INDEPENDENT_AMBULATORY_CARE_PROVIDER_SITE_OTHER): Payer: Medicare HMO

## 2018-04-27 ENCOUNTER — Encounter: Payer: Self-pay | Admitting: Family Medicine

## 2018-04-27 ENCOUNTER — Ambulatory Visit (INDEPENDENT_AMBULATORY_CARE_PROVIDER_SITE_OTHER): Payer: Medicare HMO | Admitting: Family Medicine

## 2018-04-27 VITALS — BP 101/65 | HR 70 | Temp 98.3°F | Resp 16 | Ht 65.5 in | Wt 143.0 lb

## 2018-04-27 DIAGNOSIS — E039 Hypothyroidism, unspecified: Secondary | ICD-10-CM

## 2018-04-27 DIAGNOSIS — Z0000 Encounter for general adult medical examination without abnormal findings: Secondary | ICD-10-CM

## 2018-04-27 DIAGNOSIS — E038 Other specified hypothyroidism: Secondary | ICD-10-CM

## 2018-04-27 DIAGNOSIS — Z23 Encounter for immunization: Secondary | ICD-10-CM | POA: Diagnosis not present

## 2018-04-27 DIAGNOSIS — E7800 Pure hypercholesterolemia, unspecified: Secondary | ICD-10-CM | POA: Diagnosis not present

## 2018-04-27 DIAGNOSIS — Z1211 Encounter for screening for malignant neoplasm of colon: Secondary | ICD-10-CM | POA: Diagnosis not present

## 2018-04-27 MED ORDER — TETANUS-DIPHTH-ACELL PERTUSSIS 5-2.5-18.5 LF-MCG/0.5 IM SUSP: 0.5000 mL | Freq: Once | INTRAMUSCULAR | 0 refills | Status: AC

## 2018-04-27 MED ORDER — ZOSTER VAC RECOMB ADJUVANTED 50 MCG/0.5ML IM SUSR: 0.5000 mL | Freq: Once | INTRAMUSCULAR | 1 refills | Status: AC

## 2018-04-27 NOTE — Progress Notes (Signed)
.   Subjective:   Teresa Roth is a 67 y.o. female who presents for an Initial Medicare Annual Wellness Visit.  Review of Systems    No ROS.  Medicare Wellness Visit. Additional risk factors are reflected in the social history.  Cardiac Risk Factors include: advanced age (>56men, >60 women);dyslipidemia;family history of premature cardiovascular disease;smoking/ tobacco exposure   Sleep patterns: Sleeps 8 hours, better since changing to Effexor.  Home Safety/Smoke Alarms: Feels safe in home. Smoke alarms in place.  Living environment; residence and Firearm Safety: Lives with husband in 1 story home.  Seat Belt Safety/Bike Helmet: Wears seat belt.   Female:   Pap-N/A       Mammo-11/04/2017, normal.     Dexa scan-Declines at this time.         CCS-Colonoscopy 03/08/2007, polyp. GI consult ordered today by PCP.      Objective:    There were no vitals filed for this visit. There is no height or weight on file to calculate BMI.  Advanced Directives 04/27/2018  Does Patient Have a Medical Advance Directive? Yes  Type of Advance Directive Living will;Healthcare Power of [place] in Chart? No - copy requested    Current Medications (verified) Outpatient Encounter Medications as of 04/27/2018  Medication Sig  . atorvastatin (LIPITOR) 40 MG tablet Take 1 tablet (40 mg total) by mouth daily.  . betamethasone dipropionate (DIPROLENE) 0.05 % cream Apply topically 2 (two) times daily as needed.  . Tdap (BOOSTRIX) 5-2.5-18.5 LF-MCG/0.5 injection Inject 0.5 mLs into the muscle once for 1 dose.  . venlafaxine XR (EFFEXOR-XR) 37.5 MG 24 hr capsule Take 1 capsule by mouth daily.  Marland Kitchen Zoster Vaccine Adjuvanted [facility]) injection Inject 0.5 mLs into the muscle once for 1 dose.   No facility-administered encounter medications on file as of 04/27/2018.     Allergies (verified) Sulfamethoxazole   History: Past Medical History:  Diagnosis Date  .  Anxiety   . Chronic renal insufficiency, stage III (moderate) (HCC)    CrCl 50s  . History of adenomatous polyp of colon 2008  . Hyperlipidemia    Great response to atorv 40mg  qd (06/2016)  . Strabismus    L eye; sees ophthalm  . Subclinical hypothyroidism 2018   VERY mild TSH elevation, with normal T4 and T3.  . Vasomotor flushing    postmenopausal: prempro per Dr. Waylan Rocher.   Past Surgical History:  Procedure Laterality Date  . APPENDECTOMY    . COLONOSCOPY W/ POLYPECTOMY  03/08/07   Adenomatous polyp-   Family History  Problem Relation Age of Onset  . Diabetes Mother   . Heart failure Father   . Diabetes Father    Social History   Socioeconomic History  . Marital status: Married    Spouse name: Not on file  . Number of children: Not on file  . Years of education: Not on file  . Highest education level: Not on file  Occupational History  . Not on file  Social Needs  . Financial resource strain: Not on file  . Food insecurity:    Worry: Not on file    Inability: Not on file  . Transportation needs:    Medical: Not on file    Non-medical: Not on file  Tobacco Use  . Smoking status: Never Smoker  . Smokeless tobacco: Never Used  Substance and Sexual Activity  . Alcohol use: No  . Drug use: No  . Sexual activity:  Not on file  Lifestyle  . Physical activity:    Days per week: Not on file    Minutes per session: Not on file  . Stress: Not on file  Relationships  . Social connections:    Talks on phone: Not on file    Gets together: Not on file    Attends religious service: Not on file    Active member of club or organization: Not on file    Attends meetings of clubs or organizations: Not on file    Relationship status: Not on file  Other Topics Concern  . Not on file  Social History Narrative   Married, one son.   [place]ig from [place] area.   Occupation: retired from [employer] and [employer].   No tob, no alc.    Tobacco Counseling Counseling given: Not  Answered   Activities of Daily Living In your present state of health, do you have any difficulty performing the following activities: 04/27/2018  Hearing? N  Vision? N  Difficulty concentrating or making decisions? N  Walking or climbing stairs? N  Dressing or bathing? N  Doing errands, shopping? N  Preparing Food and eating ? N  Using the Toilet? N  In the past six months, have you accidently leaked urine? N  Do you have problems with loss of bowel control? N  Managing your Medications? N  Managing your Finances? N  Housekeeping or managing your Housekeeping? N  Some recent data might be hidden     Immunizations and Health Maintenance Immunization History  Administered Date(s) Administered  . Influenza Split 10/14/2012  . Influenza-Unspecified 08/23/2017  . Pneumococcal Conjugate-13 04/06/2015  . Pneumococcal Polysaccharide-23 04/22/2016  . Td 05/21/2007  . Zoster 04/10/2011   Health Maintenance Due  Topic Date Due  . COLONOSCOPY  03/07/2017  . TETANUS/TDAP  05/20/2017    Patient Care Team: Tammi Sou, MD as PCP - General (Family Medicine) Meisinger, Sherren Mocha, MD as Consulting Physician (Obstetrics and Gynecology) Darleen Crocker, MD as Consulting Physician (Ophthalmology)  Indicate any recent Medical Services you may have received from other than [facility] providers in the past year (date may be approximate).     Assessment:   This is a routine wellness examination for Teresa Roth.  Hearing/Vision screen Hearing Screening Comments: Able to hear conversational tones w/o difficulty. No issues reported.   Vision Screening Comments: Last exam > 2 years. Dr. Talbert Forest. Wears glasses.   Dietary issues and exercise activities discussed: Current Exercise Habits: The patient does not participate in regular exercise at present(housework; errands), Exercise limited by: None identified   Diet (meal preparation, eat out, water intake, caffeinated beverages, dairy products, fruits and  vegetables): Drinks water and coffee (4 cups).   Breakfast: skips Lunch: egg; toast; sausage Dinner: protein and vegetables.   Goals    . Weight (lb) < 135 lb (61.2 kg)     Lose weight by decreasing carb intake.       Depression Screen PHQ 2/9 Scores 04/27/2018 04/27/2018 04/22/2016 04/06/2015  PHQ - 2 Score 0 0 0 2  PHQ- 9 Score 2 - - 8    Fall Risk Fall Risk  04/27/2018 04/22/2016 04/06/2015  Falls in the past year? No No No     Cognitive Function: MMSE - Mini Mental State Exam 04/27/2018  Orientation to time 5  Orientation to Place 5  Registration 3  Attention/ Calculation 5  Recall 1  Language- name 2 objects 2  Language- repeat 1  Language- follow 3  step command 3  Language- read & follow direction 1  Write a sentence 1  Copy design 1  Total score 28        Screening Tests Health Maintenance  Topic Date Due  . COLONOSCOPY  03/07/2017  . TETANUS/TDAP  05/20/2017  . DEXA SCAN  04/28/2019 (Originally 05/01/2014)  . Hepatitis C Screening  04/28/2019 (Originally 1951-08-13)  . INFLUENZA VACCINE  05/28/2018  . MAMMOGRAM  11/04/2018  . PNA vac Low Risk Adult  Completed        Plan:    Schedule eye exam.   Shingles vaccine at pharmacy.   Continue doing brain stimulating activities (puzzles, reading, adult coloring books, staying active) to keep memory sharp.   Bring a copy of your living will and/or healthcare power of attorney to your next office visit.  I have personally reviewed and noted the following in the patient's chart:   . Medical and social history . Use of alcohol, tobacco or illicit drugs  . Current medications and supplements . Functional ability and status . Nutritional status . Physical activity . Advanced directives . List of other physicians . Hospitalizations, surgeries, and ER visits in previous 12 months . Vitals . Screenings to include cognitive, depression, and falls . Referrals and appointments  In addition, I have reviewed and  discussed with patient certain preventive protocols, quality metrics, and best practice recommendations. A written personalized care plan for preventive services as well as general preventive health recommendations were provided to patient.     Gerilyn Nestle, RN   04/27/2018

## 2018-04-27 NOTE — Patient Instructions (Addendum)
Schedule eye exam.   Shingles vaccine at pharmacy.   Continue doing brain stimulating activities (puzzles, reading, adult coloring books, staying active) to keep memory sharp.   Bring a copy of your living will and/or healthcare power of attorney to your next office visit.  Health Maintenance, Female Adopting a healthy lifestyle and getting preventive care can go a long way to promote health and wellness. Talk with your health care provider about what schedule of regular examinations is right for you. This is a good chance for you to check in with your provider about disease prevention and staying healthy. In between checkups, there are plenty of things you can do on your own. Experts have done a lot of research about which lifestyle changes and preventive measures are most likely to keep you healthy. Ask your health care provider for more information. Weight and diet Eat a healthy diet  Be sure to include plenty of vegetables, fruits, low-fat dairy products, and lean protein.  Do not eat a lot of foods high in solid fats, added sugars, or salt.  Get regular exercise. This is one of the most important things you can do for your health. ? Most adults should exercise for at least 150 minutes each week. The exercise should increase your heart rate and make you sweat (moderate-intensity exercise). ? Most adults should also do strengthening exercises at least twice a week. This is in addition to the moderate-intensity exercise.  Maintain a healthy weight  Body mass index (BMI) is a measurement that can be used to identify possible weight problems. It estimates body fat based on height and weight. Your health care provider can help determine your BMI and help you achieve or maintain a healthy weight.  For females 26 years of age and older: ? A BMI below 18.5 is considered underweight. ? A BMI of 18.5 to 24.9 is normal. ? A BMI of 25 to 29.9 is considered overweight. ? A BMI of 30 and above is  considered obese.  Watch levels of cholesterol and blood lipids  You should start having your blood tested for lipids and cholesterol at 69 years of age, then have this test every 5 years.  You may need to have your cholesterol levels checked more often if: ? Your lipid or cholesterol levels are high. ? You are older than 69 years of age. ? You are at high risk for heart disease.  Cancer screening Lung Cancer  Lung cancer screening is recommended for adults 1-45 years old who are at high risk for lung cancer because of a history of smoking.  A yearly low-dose CT scan of the lungs is recommended for people who: ? Currently smoke. ? Have quit within the past 15 years. ? Have at least a 30-pack-year history of smoking. A pack year is smoking an average of one pack of cigarettes a day for 1 year.  Yearly screening should continue until it has been 15 years since you quit.  Yearly screening should stop if you develop a health problem that would prevent you from having lung cancer treatment.  Breast Cancer  Practice breast self-awareness. This means understanding how your breasts normally appear and feel.  It also means doing regular breast self-exams. Let your health care provider know about any changes, no matter how small.  If you are in your 20s or 30s, you should have a clinical breast exam (CBE) by a health care provider every 1-3 years as part of a regular health exam.  If you are 40 or older, have a CBE every year. Also consider having a breast X-ray (mammogram) every year.  If you have a family history of breast cancer, talk to your health care provider about genetic screening.  If you are at high risk for breast cancer, talk to your health care provider about having an MRI and a mammogram every year.  Breast cancer gene (BRCA) assessment is recommended for women who have family members with BRCA-related cancers. BRCA-related cancers  include: ? Breast. ? Ovarian. ? Tubal. ? Peritoneal cancers.  Results of the assessment will determine the need for genetic counseling and BRCA1 and BRCA2 testing.  Cervical Cancer Your health care provider may recommend that you be screened regularly for cancer of the pelvic organs (ovaries, uterus, and vagina). This screening involves a pelvic examination, including checking for microscopic changes to the surface of your cervix (Pap test). You may be encouraged to have this screening done every 3 years, beginning at age 21.  For women ages 30-65, health care providers may recommend pelvic exams and Pap testing every 3 years, or they may recommend the Pap and pelvic exam, combined with testing for human papilloma virus (HPV), every 5 years. Some types of HPV increase your risk of cervical cancer. Testing for HPV may also be done on women of any age with unclear Pap test results.  Other health care providers may not recommend any screening for nonpregnant women who are considered low risk for pelvic cancer and who do not have symptoms. Ask your health care provider if a screening pelvic exam is right for you.  If you have had past treatment for cervical cancer or a condition that could lead to cancer, you need Pap tests and screening for cancer for at least 20 years after your treatment. If Pap tests have been discontinued, your risk factors (such as having a new sexual partner) need to be reassessed to determine if screening should resume. Some women have medical problems that increase the chance of getting cervical cancer. In these cases, your health care provider may recommend more frequent screening and Pap tests.  Colorectal Cancer  This type of cancer can be detected and often prevented.  Routine colorectal cancer screening usually begins at 69 years of age and continues through 69 years of age.  Your health care provider may recommend screening at an earlier age if you have risk factors  for colon cancer.  Your health care provider may also recommend using home test kits to check for hidden blood in the stool.  A small camera at the end of a tube can be used to examine your colon directly (sigmoidoscopy or colonoscopy). This is done to check for the earliest forms of colorectal cancer.  Routine screening usually begins at age 50.  Direct examination of the colon should be repeated every 5-10 years through 69 years of age. However, you may need to be screened more often if early forms of precancerous polyps or small growths are found.  Skin Cancer  Check your skin from head to toe regularly.  Tell your health care provider about any new moles or changes in moles, especially if there is a change in a mole's shape or color.  Also tell your health care provider if you have a mole that is larger than the size of a pencil eraser.  Always use sunscreen. Apply sunscreen liberally and repeatedly throughout the day.  Protect yourself by wearing long sleeves, pants, a wide-brimmed hat, and   sunglasses whenever you are outside.  Heart disease, diabetes, and high blood pressure  High blood pressure causes heart disease and increases the risk of stroke. High blood pressure is more likely to develop in: ? People who have blood pressure in the high end of the normal range (130-139/85-89 mm Hg). ? People who are overweight or obese. ? People who are African American.  If you are 25-70 years of age, have your blood pressure checked every 3-5 years. If you are 86 years of age or older, have your blood pressure checked every year. You should have your blood pressure measured twice-once when you are at a hospital or clinic, and once when you are not at a hospital or clinic. Record the average of the two measurements. To check your blood pressure when you are not at a hospital or clinic, you can use: ? An automated blood pressure machine at a pharmacy. ? A home blood pressure monitor.  If  you are between 34 years and 33 years old, ask your health care provider if you should take aspirin to prevent strokes.  Have regular diabetes screenings. This involves taking a blood sample to check your fasting blood sugar level. ? If you are at a normal weight and have a low risk for diabetes, have this test once every three years after 69 years of age. ? If you are overweight and have a high risk for diabetes, consider being tested at a younger age or more often. Preventing infection Hepatitis B  If you have a higher risk for hepatitis B, you should be screened for this virus. You are considered at high risk for hepatitis B if: ? You were born in a country where hepatitis B is common. Ask your health care provider which countries are considered high risk. ? Your parents were born in a high-risk country, and you have not been immunized against hepatitis B (hepatitis B vaccine). ? You have HIV or AIDS. ? You use needles to inject street drugs. ? You live with someone who has hepatitis B. ? You have had sex with someone who has hepatitis B. ? You get hemodialysis treatment. ? You take certain medicines for conditions, including cancer, organ transplantation, and autoimmune conditions.  Hepatitis C  Blood testing is recommended for: ? Everyone born from [birth year] through 1967. ? Anyone with known risk factors for hepatitis C.  Sexually transmitted infections (STIs)  You should be screened for sexually transmitted infections (STIs) including gonorrhea and chlamydia if: ? You are sexually active and are younger than 69 years of age. ? You are older than 69 years of age and your health care provider tells you that you are at risk for this type of infection. ? Your sexual activity has changed since you were last screened and you are at an increased risk for chlamydia or gonorrhea. Ask your health care provider if you are at risk.  If you do not have HIV, but are at risk, it may be recommended  that you take a prescription medicine daily to prevent HIV infection. This is called pre-exposure prophylaxis (PrEP). You are considered at risk if: ? You are sexually active and do not regularly use condoms or know the HIV status of your partner(s). ? You take drugs by injection. ? You are sexually active with a partner who has HIV.  Talk with your health care provider about whether you are at high risk of being infected with HIV. If you choose to begin PrEP, you  should first be tested for HIV. You should then be tested every 3 months for as long as you are taking PrEP. Pregnancy  If you are premenopausal and you may become pregnant, ask your health care provider about preconception counseling.  If you may become pregnant, take 400 to 800 micrograms (mcg) of folic acid every day.  If you want to prevent pregnancy, talk to your health care provider about birth control (contraception). Osteoporosis and menopause  Osteoporosis is a disease in which the bones lose minerals and strength with aging. This can result in serious bone fractures. Your risk for osteoporosis can be identified using a bone density scan.  If you are 65 years of age or older, or if you are at risk for osteoporosis and fractures, ask your health care provider if you should be screened.  Ask your health care provider whether you should take a calcium or vitamin D supplement to lower your risk for osteoporosis.  Menopause may have certain physical symptoms and risks.  Hormone replacement therapy may reduce some of these symptoms and risks. Talk to your health care provider about whether hormone replacement therapy is right for you. Follow these instructions at home:  Schedule regular health, dental, and eye exams.  Stay current with your immunizations.  Do not use any tobacco products including cigarettes, chewing tobacco, or electronic cigarettes.  If you are pregnant, do not drink alcohol.  If you are  breastfeeding, limit how much and how often you drink alcohol.  Limit alcohol intake to no more than 1 drink per day for nonpregnant women. One drink equals 12 ounces of beer, 5 ounces of wine, or 1 ounces of hard liquor.  Do not use street drugs.  Do not share needles.  Ask your health care provider for help if you need support or information about quitting drugs.  Tell your health care provider if you often feel depressed.  Tell your health care provider if you have ever been abused or do not feel safe at home. This information is not intended to replace advice given to you by your health care provider. Make sure you discuss any questions you have with your health care provider. Document Released: 04/29/2011 Document Revised: 03/21/2016 Document Reviewed: 07/18/2015 Elsevier Interactive Patient Education  2018 [employer] Inc[employer]  

## 2018-04-27 NOTE — Patient Instructions (Signed)

## 2018-04-27 NOTE — Progress Notes (Signed)
Office Note 04/27/2018  CC:  Chief Complaint  Patient presents with  . Annual Exam    Pt is fasting.     HPI:  Teresa Roth is a 67 y.o. female who is here for annual health maintenance exam. GYN: Dr. Willis Modena.  Dental and eyes: UTD. Exercise: walks a little bit. Diet: healthy diet, just eating larger portion sizes lately.   Past Medical History:  Diagnosis Date  . Anxiety   . Chronic renal insufficiency, stage III (moderate) (HCC)    CrCl 50s  . History of adenomatous polyp of colon 2008  . Hyperlipidemia    Great response to atorv 40mg  qd (06/2016)  . Strabismus    L eye; sees ophthalm  . Subclinical hypothyroidism 2018   VERY mild TSH elevation, with normal T4 and T3.  . Vasomotor flushing    postmenopausal: prempro per Dr. Waylan Rocher.    Past Surgical History:  Procedure Laterality Date  . APPENDECTOMY    . COLONOSCOPY W/ POLYPECTOMY  03/08/07   Adenomatous polyp-    Family History  Problem Relation Age of Onset  . Diabetes Mother   . Heart failure Father   . Diabetes Father     Social History   Socioeconomic History  . Marital status: Married    Spouse name: Not on file  . Number of children: Not on file  . Years of education: Not on file  . Highest education level: Not on file  Occupational History  . Not on file  Social Needs  . Financial resource strain: Not on file  . Food insecurity:    Worry: Not on file    Inability: Not on file  . Transportation needs:    Medical: Not on file    Non-medical: Not on file  Tobacco Use  . Smoking status: Never Smoker  . Smokeless tobacco: Never Used  Substance and Sexual Activity  . Alcohol use: No  . Drug use: No  . Sexual activity: Not on file  Lifestyle  . Physical activity:    Days per week: Not on file    Minutes per session: Not on file  . Stress: Not on file  Relationships  . Social connections:    Talks on phone: Not on file    Gets together: Not on file    Attends religious  service: Not on file    Active member of club or organization: Not on file    Attends meetings of clubs or organizations: Not on file    Relationship status: Not on file  . Intimate partner violence:    Fear of current or ex partner: Not on file    Emotionally abused: Not on file    Physically abused: Not on file    Forced sexual activity: Not on file  Other Topics Concern  . Not on file  Social History Narrative   Married, one son.   Orig from [place] area.   Occupation: retired from [employer] and [employer].   No tob, no alc.    Outpatient Medications Prior to Visit  Medication Sig Dispense Refill  . atorvastatin (LIPITOR) 40 MG tablet Take 1 tablet (40 mg total) by mouth daily. 90 tablet 3  . betamethasone dipropionate (DIPROLENE) 0.05 % cream Apply topically 2 (two) times daily as needed. 45 g 3  . venlafaxine XR (EFFEXOR-XR) 37.5 MG 24 hr capsule Take 1 capsule by mouth daily.  4  . buPROPion (WELLBUTRIN XL) 300 MG 24 hr tablet Take 1 tablet  by mouth daily.     No facility-administered medications prior to visit.     Allergies  Allergen Reactions  . Sulfamethoxazole     REACTION: rash    ROS Review of Systems  Constitutional: Negative for appetite change, chills, fatigue and fever.  HENT: Negative for congestion, dental problem, ear pain and sore throat.   Eyes: Negative for discharge, redness and visual disturbance.  Respiratory: Negative for cough, chest tightness, shortness of breath and wheezing.   Cardiovascular: Negative for chest pain, palpitations and leg swelling.  Gastrointestinal: Negative for abdominal pain, blood in stool, diarrhea, nausea and vomiting.  Genitourinary: Negative for difficulty urinating, dysuria, flank pain, frequency, hematuria and urgency.  Musculoskeletal: Positive for arthralgias (PIPs and L LB/L hips.  ). Negative for back pain, joint swelling, myalgias and neck stiffness.  Skin: Negative for pallor and rash.  Neurological: Negative for  dizziness, speech difficulty, weakness and headaches.  Hematological: Negative for adenopathy. Does not bruise/bleed easily.  Psychiatric/Behavioral: Negative for confusion and sleep disturbance. The patient is not nervous/anxious.     PE; Blood pressure 101/65, pulse 70, temperature 98.3 F (36.8 C), temperature source Oral, resp. rate 16, height 5' 5.5" (1.664 m), weight 143 lb (64.9 kg), SpO2 99 %. Body mass index is 23.43 kg/m.  Pt examined with Helayne Seminole, CMA, as chaperone. Gen: Alert, well appearing.  Patient is oriented to person, place, time, and situation. AFFECT: pleasant, lucid thought and speech. ENT: Ears: EACs clear, normal epithelium.  TMs with good light reflex and landmarks bilaterally.  Eyes: no injection, icteris, swelling, or exudate.  EOMI, PERRLA. Nose: no drainage or turbinate edema/swelling.  No injection or focal lesion.  Mouth: lips without lesion/swelling.  Oral mucosa pink and moist.  Dentition intact and without obvious caries or gingival swelling.  Oropharynx without erythema, exudate, or swelling.  Neck: supple/nontender.  No LAD, mass, or TM.  Carotid pulses 2+ bilaterally, without bruits. CV: RRR, no m/r/g.   LUNGS: CTA bilat, nonlabored resps, good aeration in all lung fields. ABD: soft, NT, ND, BS normal.  No hepatospenomegaly or mass.  No bruits. EXT: no clubbing, cyanosis, or edema.  Musculoskeletal: no joint swelling, erythema, warmth, or tenderness.  ROM of all joints intact. Skin - no sores or suspicious lesions or rashes or color changes   Pertinent labs:  Lab Results  Component Value Date   TSH 4.84 (H) 04/23/2017   Lab Results  Component Value Date   WBC 5.8 04/23/2017   HGB 13.0 04/23/2017   HCT 39.2 04/23/2017   MCV 92.9 04/23/2017   PLT 353 04/23/2017   Lab Results  Component Value Date   CREATININE 1.06 (H) 04/23/2017   BUN 11 04/23/2017   NA 139 04/23/2017   K 4.6 04/23/2017   CL 103 04/23/2017   CO2 23 04/23/2017    Lab Results  Component Value Date   ALT 23 04/23/2017   AST 18 04/23/2017   ALKPHOS 101 04/23/2017   BILITOT 0.6 04/23/2017   Lab Results  Component Value Date   CHOL 176 04/23/2017   Lab Results  Component Value Date   HDL 75 04/23/2017   Lab Results  Component Value Date   LDLCALC 82 04/23/2017   Lab Results  Component Value Date   TRIG 96 04/23/2017   Lab Results  Component Value Date   CHOLHDL 2.3 04/23/2017     ASSESSMENT AND PLAN:   Health maintenance exam: Reviewed age and gender appropriate health maintenance issues (prudent diet,  regular exercise, health risks of tobacco and excessive alcohol, use of seatbelts, fire alarms in home, use of sunscreen).  Also reviewed age and gender appropriate health screening as well as vaccine recommendations. Vaccines: Tdap due--rx sent to pharmacy.   Shingrix discussed--I've sent rx to pharmacy for her in the past--she is still being told by pharmacy that they don't have any available at this time. Labs: CBC w/diff, CMET, FLP, TSH. Cervical ca screening: per GYN (UTD). Breast ca screening: per GYN (UTD). Colon ca screening: referred to GI for colonoscopy. Osteoporosis screening/DEXA:--pt declines.  Her GYN switched her to venlafaxine from wellbutrin due to hot flashes---she is improved but not happy about increased appetite and wt gain on venlafaxine.  Reassured pt that this should level off.  An After Visit Summary was printed and given to the patient.  FOLLOW UP:  Return in about 1 year (around 04/28/2019) for annual CPE (fasting).  Signed:  Crissie Sickles, MD           04/27/2018

## 2018-04-27 NOTE — Progress Notes (Signed)
AWV reviewed and agree. Signed:  Crissie Sickles, MD           04/27/2018

## 2018-04-28 LAB — COMPREHENSIVE METABOLIC PANEL
AG Ratio: 1.6 (calc) (ref 1.0–2.5)
ALKALINE PHOSPHATASE (APISO): 101 U/L (ref 33–130)
ALT: 19 U/L (ref 6–29)
AST: 19 U/L (ref 10–35)
Albumin: 4.2 g/dL (ref 3.6–5.1)
BUN/Creatinine Ratio: 14 (calc) (ref 6–22)
BUN: 14 mg/dL (ref 7–25)
CHLORIDE: 103 mmol/L (ref 98–110)
CO2: 27 mmol/L (ref 20–32)
CREATININE: 1 mg/dL — AB (ref 0.50–0.99)
Calcium: 9.5 mg/dL (ref 8.6–10.4)
GLOBULIN: 2.7 g/dL (ref 1.9–3.7)
Glucose, Bld: 86 mg/dL (ref 65–99)
Potassium: 4.2 mmol/L (ref 3.5–5.3)
Sodium: 138 mmol/L (ref 135–146)
Total Bilirubin: 0.6 mg/dL (ref 0.2–1.2)
Total Protein: 6.9 g/dL (ref 6.1–8.1)

## 2018-04-28 LAB — TSH: TSH: 5.26 mIU/L — ABNORMAL HIGH (ref 0.40–4.50)

## 2018-04-28 LAB — CBC WITH DIFFERENTIAL/PLATELET
BASOS ABS: 19 {cells}/uL (ref 0–200)
Basophils Relative: 0.3 %
EOS PCT: 1.1 %
Eosinophils Absolute: 68 cells/uL (ref 15–500)
HEMATOCRIT: 38.4 % (ref 35.0–45.0)
Hemoglobin: 13.1 g/dL (ref 11.7–15.5)
LYMPHS ABS: 1631 {cells}/uL (ref 850–3900)
MCH: 31.1 pg (ref 27.0–33.0)
MCHC: 34.1 g/dL (ref 32.0–36.0)
MCV: 91.2 fL (ref 80.0–100.0)
MPV: 10.7 fL (ref 7.5–12.5)
Monocytes Relative: 8 %
NEUTROS PCT: 64.3 %
Neutro Abs: 3987 cells/uL (ref 1500–7800)
PLATELETS: 293 10*3/uL (ref 140–400)
RBC: 4.21 10*6/uL (ref 3.80–5.10)
RDW: 12.5 % (ref 11.0–15.0)
TOTAL LYMPHOCYTE: 26.3 %
WBC mixed population: 496 cells/uL (ref 200–950)
WBC: 6.2 10*3/uL (ref 3.8–10.8)

## 2018-04-28 LAB — LIPID PANEL
Cholesterol: 179 mg/dL (ref <200)
HDL: 68 mg/dL (ref >50)
LDL CHOLESTEROL (CALC): 92 mg/dL
Non-HDL Cholesterol (Calc): 111 mg/dL (calc) (ref <130)
TRIGLYCERIDES: 94 mg/dL (ref <150)
Total CHOL/HDL Ratio: 2.6 (calc) (ref <5.0)

## 2018-05-26 ENCOUNTER — Encounter: Payer: Self-pay | Admitting: Family Medicine

## 2018-05-28 ENCOUNTER — Other Ambulatory Visit: Payer: Self-pay | Admitting: Family Medicine

## 2018-05-29 ENCOUNTER — Ambulatory Visit: Payer: Medicare HMO

## 2018-06-01 ENCOUNTER — Encounter: Payer: Self-pay | Admitting: Gastroenterology

## 2018-06-08 DIAGNOSIS — H2513 Age-related nuclear cataract, bilateral: Secondary | ICD-10-CM | POA: Diagnosis not present

## 2018-06-08 DIAGNOSIS — H25043 Posterior subcapsular polar age-related cataract, bilateral: Secondary | ICD-10-CM | POA: Diagnosis not present

## 2018-06-08 DIAGNOSIS — H25013 Cortical age-related cataract, bilateral: Secondary | ICD-10-CM | POA: Diagnosis not present

## 2018-06-08 DIAGNOSIS — H5010 Unspecified exotropia: Secondary | ICD-10-CM | POA: Diagnosis not present

## 2018-06-08 DIAGNOSIS — H02839 Dermatochalasis of unspecified eye, unspecified eyelid: Secondary | ICD-10-CM | POA: Diagnosis not present

## 2018-07-15 ENCOUNTER — Ambulatory Visit (AMBULATORY_SURGERY_CENTER): Payer: Self-pay | Admitting: *Deleted

## 2018-07-15 ENCOUNTER — Encounter: Payer: Self-pay | Admitting: Gastroenterology

## 2018-07-15 VITALS — Ht 66.0 in | Wt 144.6 lb

## 2018-07-15 DIAGNOSIS — H5015 Alternating exotropia: Secondary | ICD-10-CM | POA: Diagnosis not present

## 2018-07-15 DIAGNOSIS — H5212 Myopia, left eye: Secondary | ICD-10-CM | POA: Diagnosis not present

## 2018-07-15 DIAGNOSIS — H25013 Cortical age-related cataract, bilateral: Secondary | ICD-10-CM | POA: Diagnosis not present

## 2018-07-15 DIAGNOSIS — H5201 Hypermetropia, right eye: Secondary | ICD-10-CM | POA: Diagnosis not present

## 2018-07-15 DIAGNOSIS — H5231 Anisometropia: Secondary | ICD-10-CM | POA: Diagnosis not present

## 2018-07-15 DIAGNOSIS — Z86010 Personal history of colonic polyps: Secondary | ICD-10-CM

## 2018-07-15 DIAGNOSIS — H5053 Vertical heterophoria: Secondary | ICD-10-CM | POA: Diagnosis not present

## 2018-07-15 DIAGNOSIS — H52222 Regular astigmatism, left eye: Secondary | ICD-10-CM | POA: Diagnosis not present

## 2018-07-15 DIAGNOSIS — H5334 Suppression of binocular vision: Secondary | ICD-10-CM | POA: Diagnosis not present

## 2018-07-15 MED ORDER — NA SULFATE-K SULFATE-MG SULF 17.5-3.13-1.6 GM/177ML PO SOLN: 1.0000 | Freq: Once | ORAL | 0 refills | Status: AC

## 2018-07-15 NOTE — Progress Notes (Signed)
No egg or soy allergy known to patient  No issues with past sedation with any surgeries  or procedures, no intubation problems  No diet pills per patient No home 02 use per patient  No blood thinners per patient  Pt denies issues with constipation  No A fib or A flutter  EMMI video sent to pt's e mail pt declined   

## 2018-07-29 ENCOUNTER — Encounter: Payer: Self-pay | Admitting: Gastroenterology

## 2018-07-29 ENCOUNTER — Ambulatory Visit (AMBULATORY_SURGERY_CENTER): Payer: Medicare HMO | Admitting: Gastroenterology

## 2018-07-29 VITALS — BP 127/63 | HR 68 | Temp 98.9°F | Resp 20 | Ht 65.0 in | Wt 143.0 lb

## 2018-07-29 DIAGNOSIS — Z1211 Encounter for screening for malignant neoplasm of colon: Secondary | ICD-10-CM | POA: Diagnosis not present

## 2018-07-29 DIAGNOSIS — Z86010 Personal history of colonic polyps: Secondary | ICD-10-CM

## 2018-07-29 MED ORDER — SODIUM CHLORIDE 0.9 % IV SOLN: 500.0000 mL | Freq: Once | INTRAVENOUS | Status: DC

## 2018-07-29 NOTE — Op Note (Signed)
[facility] Patient Name: Teresa Roth Procedure Date: 07/29/2018 11:31 AM MRN: [id number] Endoscopist: Ladene Artist , MD Age: 67 Referring MD:  Date of Birth: [birth year] Gender: Female Account #: [id number] Procedure:                Colonoscopy Indications:              Surveillance: Personal history of adenomatous                            polyps on last colonoscopy 5 years ago Medicines:                Monitored Anesthesia Care Procedure:                Pre-Anesthesia Assessment:                           - Prior to the procedure, a History and Physical                            was performed, and patient medications and                            allergies were reviewed. The patient's tolerance of                            previous anesthesia was also reviewed. The risks                            and benefits of the procedure and the sedation                            options and risks were discussed with the patient.                            All questions were answered, and informed consent                            was obtained. Prior Anticoagulants: The patient has                            taken no previous anticoagulant or antiplatelet                            agents. ASA Grade Assessment: II - A patient with                            mild systemic disease. After reviewing the risks                            and benefits, the patient was deemed in                            satisfactory condition to undergo the procedure.  After obtaining informed consent, the colonoscope                            was passed under direct vision. Throughout the                            procedure, the patient's blood pressure, pulse, and                            oxygen saturations were monitored continuously. The                            Colonoscope was introduced through the anus and                            advanced to the the  cecum, identified by                            appendiceal orifice and ileocecal valve. The                            ileocecal valve, appendiceal orifice, and rectum                            were photographed. The quality of the bowel                            preparation was good. The colonoscopy was performed                            without difficulty. The patient tolerated the                            procedure well. Scope In: 11:42:58 AM Scope Out: 11:57:00 AM Scope Withdrawal Time: 0 hours 8 minutes 42 seconds  Total Procedure Duration: 0 hours 14 minutes 2 seconds  Findings:                 The perianal and digital rectal examinations were                            normal.                           Internal hemorrhoids were found during                            retroflexion. The hemorrhoids were small and Grade                            I (internal hemorrhoids that do not prolapse).                           Anal papilla(e) were hypertrophied.  The exam was otherwise without abnormality on                            direct and retroflexion views. Complications:            No immediate complications. Estimated blood loss:                            None. Estimated Blood Loss:     Estimated blood loss: none. Impression:               - Internal hemorrhoids.                           - Anal papilla(e) were hypertrophied.                           - The examination was otherwise normal on direct                            and retroflexion views.                           - No specimens collected. Recommendation:           - Repeat colonoscopy in 5 years for surveillance.                           - Patient has a contact number available for                            emergencies. The signs and symptoms of potential                            delayed complications were discussed with the                            patient. Return to normal  activities tomorrow.                            Written discharge instructions were provided to the                            patient.                           - Resume previous diet.                           - Continue present medications. Ladene Artist, MD 07/29/2018 12:01:37 PM This report has been signed electronically.

## 2018-07-29 NOTE — Progress Notes (Signed)
A and O x3. Report to RN. Tolerated MAC anesthesia well.

## 2018-07-29 NOTE — Progress Notes (Signed)
Pt's states no medical or surgical changes since previsit or office visit. 

## 2018-07-29 NOTE — Patient Instructions (Signed)
YOU HAD AN ENDOSCOPIC PROCEDURE TODAY AT THE [facility]:   Refer to the procedure report that was given to you for any specific questions about what was found during the examination.  If the procedure report does not answer your questions, please call your gastroenterologist to clarify.  If you requested that your care partner not be given the details of your procedure findings, then the procedure report has been included in a sealed envelope for you to review at your convenience later.  YOU SHOULD EXPECT: Some feelings of bloating in the abdomen. Passage of more gas than usual.  Walking can help get rid of the air that was put into your GI tract during the procedure and reduce the bloating. If you had a lower endoscopy (such as a colonoscopy or flexible sigmoidoscopy) you may notice spotting of blood in your stool or on the toilet paper. If you underwent a bowel prep for your procedure, you may not have a normal bowel movement for a few days.  Please Note:  You might notice some irritation and congestion in your nose or some drainage.  This is from the oxygen used during your procedure.  There is no need for concern and it should clear up in a day or so.  SYMPTOMS TO REPORT IMMEDIATELY:   Following lower endoscopy (colonoscopy or flexible sigmoidoscopy):  Excessive amounts of blood in the stool  Significant tenderness or worsening of abdominal pains  Swelling of the abdomen that is new, acute  Fever of 100F or higher  For urgent or emergent issues, a gastroenterologist can be reached at any hour by calling [phone].   DIET:  We do recommend a small meal at first, but then you may proceed to your regular diet.  Drink plenty of fluids but you should avoid alcoholic beverages for 24 hours.  MEDICATIONS: Continue present medications.  Please see handouts given to you by your recovery nurse.  ACTIVITY:  You should plan to take it easy for the rest of today and you should NOT  DRIVE or use heavy machinery until tomorrow (because of the sedation medicines used during the test).    FOLLOW UP: Our staff will call the number listed on your records the next business day following your procedure to check on you and address any questions or concerns that you may have regarding the information given to you following your procedure. If we do not reach you, we will leave a message.  However, if you are feeling well and you are not experiencing any problems, there is no need to return our call.  We will assume that you have returned to your regular daily activities without incident.  If any biopsies were taken you will be contacted by phone or by letter within the next 1-3 weeks.  Please call us at [phone] if you have not heard about the biopsies in 3 weeks.   Thank you for allowing us to provide for your healthcare needs today.   SIGNATURES/CONFIDENTIALITY: You and/or your care partner have signed paperwork which will be entered into your electronic medical record.  These signatures attest to the fact that that the information above on your After Visit Summary has been reviewed and is understood.  Full responsibility of the confidentiality of this discharge information lies with you and/or your care-partner. 

## 2018-07-30 ENCOUNTER — Encounter: Payer: Self-pay | Admitting: Family Medicine

## 2018-07-30 ENCOUNTER — Telehealth: Payer: Self-pay | Admitting: *Deleted

## 2018-07-30 NOTE — Telephone Encounter (Signed)
  Follow up Call-  Call back number 07/29/2018  Post procedure Call Back phone  # [phone]  Permission to leave phone message Yes  Some recent data might be hidden     Patient questions:  Do you have a fever, pain , or abdominal swelling? No. Pain Score  0 *  Have you tolerated food without any problems? Yes.    Have you been able to return to your normal activities? Yes.    Do you have any questions about your discharge instructions: Diet   No. Medications  No. Follow up visit  No.  Do you have questions or concerns about your Care? No.  Actions: * If pain score is 4 or above: No action needed, pain <4.

## 2018-08-20 DIAGNOSIS — R69 Illness, unspecified: Secondary | ICD-10-CM | POA: Diagnosis not present

## 2018-09-08 ENCOUNTER — Other Ambulatory Visit: Payer: Self-pay | Admitting: Family Medicine

## 2018-09-08 NOTE — Telephone Encounter (Signed)
I have no diagnosis documented in the chart that she would use this cream for. Ask her what she uses it for:  Looks like she was already on it when I acquired her as a patient from Dr. Leanne Chang. -thx

## 2018-09-09 ENCOUNTER — Encounter: Payer: Self-pay | Admitting: Family Medicine

## 2018-09-09 MED ORDER — FLUTICASONE PROPIONATE 0.05 % EX CREA: TOPICAL_CREAM | Freq: Two times a day (BID) | CUTANEOUS | 1 refills | Status: DC

## 2018-09-09 NOTE — Telephone Encounter (Signed)
Patient returned call regarding medication.  I informed patient that new rx was called into [facility]. Patient was appreciative.

## 2018-09-09 NOTE — Telephone Encounter (Signed)
SW pt, she stated that she has been using this cream for psoriasis. She stated that it is expensive and is willing to try something else if Dr. Anitra Lauth can recommend a cream that may be cheaper. She stated that she has tried to find out from her insurance what would be cheaper but they will not tell her.  Please advise. Thanks.

## 2018-09-09 NOTE — Telephone Encounter (Signed)
Left message for pt to call back  °

## 2018-09-09 NOTE — Telephone Encounter (Signed)
OK, I'll eRx generic cutivate cream.

## 2018-10-30 ENCOUNTER — Other Ambulatory Visit: Payer: Self-pay | Admitting: *Deleted

## 2018-10-30 MED ORDER — VENLAFAXINE HCL ER 37.5 MG PO CP24: 37.5000 mg | ORAL_CAPSULE | Freq: Every day | ORAL | 1 refills | Status: DC

## 2018-10-30 MED ORDER — FLUTICASONE PROPIONATE 0.05 % EX CREA: TOPICAL_CREAM | Freq: Two times a day (BID) | CUTANEOUS | 1 refills | Status: DC

## 2018-10-30 MED ORDER — ATORVASTATIN CALCIUM 40 MG PO TABS: 40.0000 mg | ORAL_TABLET | Freq: Every day | ORAL | 1 refills | Status: DC

## 2018-11-25 ENCOUNTER — Other Ambulatory Visit: Payer: Self-pay | Admitting: Family Medicine

## 2018-12-03 DIAGNOSIS — Z124 Encounter for screening for malignant neoplasm of cervix: Secondary | ICD-10-CM | POA: Diagnosis not present

## 2018-12-03 DIAGNOSIS — N951 Menopausal and female climacteric states: Secondary | ICD-10-CM | POA: Diagnosis not present

## 2018-12-03 DIAGNOSIS — L989 Disorder of the skin and subcutaneous tissue, unspecified: Secondary | ICD-10-CM | POA: Diagnosis not present

## 2018-12-03 DIAGNOSIS — Z1231 Encounter for screening mammogram for malignant neoplasm of breast: Secondary | ICD-10-CM | POA: Diagnosis not present

## 2018-12-03 LAB — HM MAMMOGRAPHY

## 2018-12-08 ENCOUNTER — Encounter: Payer: Self-pay | Admitting: Family Medicine

## 2018-12-13 ENCOUNTER — Encounter: Payer: Self-pay | Admitting: Family Medicine

## 2019-04-13 DIAGNOSIS — B372 Candidiasis of skin and nail: Secondary | ICD-10-CM | POA: Diagnosis not present

## 2019-04-13 DIAGNOSIS — R58 Hemorrhage, not elsewhere classified: Secondary | ICD-10-CM | POA: Diagnosis not present

## 2019-04-13 DIAGNOSIS — L570 Actinic keratosis: Secondary | ICD-10-CM | POA: Diagnosis not present

## 2019-04-16 ENCOUNTER — Other Ambulatory Visit: Payer: Self-pay | Admitting: Family Medicine

## 2019-05-07 ENCOUNTER — Other Ambulatory Visit: Payer: Self-pay | Admitting: Family Medicine

## 2019-06-21 ENCOUNTER — Encounter: Payer: Medicare HMO | Admitting: Family Medicine

## 2019-07-07 ENCOUNTER — Ambulatory Visit (INDEPENDENT_AMBULATORY_CARE_PROVIDER_SITE_OTHER): Payer: Medicare Other | Admitting: Family Medicine

## 2019-07-07 ENCOUNTER — Other Ambulatory Visit (INDEPENDENT_AMBULATORY_CARE_PROVIDER_SITE_OTHER): Payer: Medicare Other

## 2019-07-07 ENCOUNTER — Encounter: Payer: Self-pay | Admitting: Family Medicine

## 2019-07-07 ENCOUNTER — Other Ambulatory Visit: Payer: Self-pay

## 2019-07-07 VITALS — BP 119/74 | HR 83 | Temp 98.3°F | Resp 16 | Ht 65.5 in | Wt 145.6 lb

## 2019-07-07 DIAGNOSIS — E7800 Pure hypercholesterolemia, unspecified: Secondary | ICD-10-CM | POA: Diagnosis not present

## 2019-07-07 DIAGNOSIS — E039 Hypothyroidism, unspecified: Secondary | ICD-10-CM | POA: Diagnosis not present

## 2019-07-07 DIAGNOSIS — N183 Chronic kidney disease, stage 3 unspecified: Secondary | ICD-10-CM

## 2019-07-07 DIAGNOSIS — Z0000 Encounter for general adult medical examination without abnormal findings: Secondary | ICD-10-CM | POA: Diagnosis not present

## 2019-07-07 DIAGNOSIS — Z23 Encounter for immunization: Secondary | ICD-10-CM

## 2019-07-07 DIAGNOSIS — E038 Other specified hypothyroidism: Secondary | ICD-10-CM

## 2019-07-07 LAB — COMPREHENSIVE METABOLIC PANEL
ALT: 17 U/L (ref 0–35)
AST: 15 U/L (ref 0–37)
Albumin: 4.1 g/dL (ref 3.5–5.2)
Alkaline Phosphatase: 106 U/L (ref 39–117)
BUN: 13 mg/dL (ref 6–23)
CO2: 27 mEq/L (ref 19–32)
Calcium: 9.6 mg/dL (ref 8.4–10.5)
Chloride: 104 mEq/L (ref 96–112)
Creatinine, Ser: 0.92 mg/dL (ref 0.40–1.20)
GFR: 60.32 mL/min (ref >60.00)
Glucose, Bld: 82 mg/dL (ref 70–99)
Potassium: 4.6 mEq/L (ref 3.5–5.1)
Sodium: 139 mEq/L (ref 135–145)
Total Bilirubin: 0.7 mg/dL (ref 0.2–1.2)
Total Protein: 6.8 g/dL (ref 6.0–8.3)

## 2019-07-07 LAB — LIPID PANEL
Cholesterol: 170 mg/dL (ref 0–200)
HDL: 61.4 mg/dL (ref >39.00)
LDL Cholesterol: 91 mg/dL (ref 0–99)
NonHDL: 108.3
Total CHOL/HDL Ratio: 3
Triglycerides: 86 mg/dL (ref 0.0–149.0)
VLDL: 17.2 mg/dL (ref 0.0–40.0)

## 2019-07-07 LAB — CBC WITH DIFFERENTIAL/PLATELET
Basophils Absolute: 0 10*3/uL (ref 0.0–0.1)
Basophils Relative: 0.7 % (ref 0.0–3.0)
Eosinophils Absolute: 0.1 10*3/uL (ref 0.0–0.7)
Eosinophils Relative: 1.3 % (ref 0.0–5.0)
HCT: 38.6 % (ref 36.0–46.0)
Hemoglobin: 12.7 g/dL (ref 12.0–15.0)
Lymphocytes Relative: 26.9 % (ref 12.0–46.0)
Lymphs Abs: 1.6 10*3/uL (ref 0.7–4.0)
MCHC: 32.9 g/dL (ref 30.0–36.0)
MCV: 94.5 fl (ref 78.0–100.0)
Monocytes Absolute: 0.4 10*3/uL (ref 0.1–1.0)
Monocytes Relative: 6.7 % (ref 3.0–12.0)
Neutro Abs: 3.7 10*3/uL (ref 1.4–7.7)
Neutrophils Relative %: 64.4 % (ref 43.0–77.0)
Platelets: 285 10*3/uL (ref 150.0–400.0)
RBC: 4.08 Mil/uL (ref 3.87–5.11)
RDW: 13.4 % (ref 11.5–15.5)
WBC: 5.8 10*3/uL (ref 4.0–10.5)

## 2019-07-07 LAB — TSH: TSH: 5.2 u[IU]/mL — ABNORMAL HIGH (ref 0.35–4.50)

## 2019-07-07 LAB — T4, FREE: Free T4: 0.79 ng/dL (ref 0.60–1.60)

## 2019-07-07 NOTE — Progress Notes (Signed)
Office Note 07/07/2019  CC:  Chief Complaint  Patient presents with  . Annual Exam    pt is fasting    HPI:  Teresa Roth is a 68 y.o. female who is here for annual health maintenancee exam.  No acute complaints.  Feeling well. Frustrated with no success losing weight. Diet is fair. No exercise lately b/c of heat, but even when not hot all the does is walk.   Past Medical History:  Diagnosis Date  . Allergy   . Anxiety   . Arthritis   . Cataract    forming-  . Chronic renal insufficiency, stage III (moderate) (HCC)    CrCl 50s  . History of adenomatous polyp of colon 2008   None on rpt TCS 07/29/18->recall 07/2023.  Marland Kitchen Hyperlipidemia    Great response to atorv 40mg  qd (06/2016)  . Menopausal symptoms    venlafaxine ER 37.5mg  qd  . Psoriasis   . Strabismus    L eye; sees ophthalm  . Subclinical hypothyroidism 2018   VERY mild TSH elevation, with normal T4 and T3.  . Vasomotor flushing    postmenopausal: prempro per Dr. Waylan Rocher.    Past Surgical History:  Procedure Laterality Date  . APPENDECTOMY    . COLONOSCOPY  2014;  07/29/18   No polyps 07/2018.  Anal papilla(e) were hypertrophied.  Recall 07/2023.  Marland Kitchen COLONOSCOPY W/ POLYPECTOMY  03/08/07   Adenomatous polyp-  . POLYPECTOMY    . TUBAL LIGATION     1978    Family History  Problem Relation Age of Onset  . Diabetes Mother   . Stomach cancer Mother   . Heart failure Father   . Diabetes Father   . Colon polyps Father   . Colon cancer Neg Hx   . Esophageal cancer Neg Hx   . Rectal cancer Neg Hx     Social History   Socioeconomic History  . Marital status: Married    Spouse name: Not on file  . Number of children: Not on file  . Years of education: Not on file  . Highest education level: Not on file  Occupational History  . Not on file  Social Needs  . Financial resource strain: Not on file  . Food insecurity    Worry: Not on file    Inability: Not on file  . Transportation needs   Medical: Not on file    Non-medical: Not on file  Tobacco Use  . Smoking status: Never Smoker  . Smokeless tobacco: Never Used  Substance and Sexual Activity  . Alcohol use: No  . Drug use: No  . Sexual activity: Not on file  Lifestyle  . Physical activity    Days per week: Not on file    Minutes per session: Not on file  . Stress: Not on file  Relationships  . Social Herbalist on phone: Not on file    Gets together: Not on file    Attends religious service: Not on file    Active member of club or organization: Not on file    Attends meetings of clubs or organizations: Not on file    Relationship status: Not on file  . Intimate partner violence    Fear of current or ex partner: Not on file    Emotionally abused: Not on file    Physically abused: Not on file    Forced sexual activity: Not on file  Other Topics Concern  . Not on  file  Social History Narrative   Married, one son.   Orig from [place] area.   Occupation: retired from [employer].   No tob, no alc.    Outpatient Medications Prior to Visit  Medication Sig Dispense Refill  . acetaminophen (TYLENOL) 500 MG tablet Take 1,000 mg by mouth every 6 (six) hours as needed.    Marland Kitchen atorvastatin (LIPITOR) 40 MG tablet Take 1 tablet (40 mg total) by mouth daily. OFFICE VISIT NEEDED 90 tablet 1  . Influenza vac split quadrivalent PF (FLUZONE HIGH-DOSE) 0.5 ML injection Fluzone High-Dose 2018-2019 (PF) 180 mcg/0.5 mL intramuscular syringe  TO BE ADMINISTERED BY PHARMACIST FOR IMMUNIZATION    . pseudoephedrine-acetaminophen (TYLENOL SINUS) 30-500 MG TABS tablet Take 1 tablet by mouth every 4 (four) hours as needed.    . venlafaxine XR (EFFEXOR-XR) 37.5 MG 24 hr capsule Take 1 capsule (37.5 mg total) by mouth daily. 90 capsule 1  . Aspirin-Salicylamide-Caffeine (BC HEADACHE POWDER PO) Take by mouth as needed.    . fluticasone (CUTIVATE) 0.05 % cream Apply topically 2 (two) times daily. (Patient not taking: Reported  on 07/07/2019) 90 g 1   Facility-Administered Medications Prior to Visit  Medication Dose Route Frequency Provider Last Rate Last Dose  . 0.9 %  sodium chloride infusion  500 mL Intravenous Once Ladene Artist, MD        Allergies  Allergen Reactions  . Sulfamethoxazole     REACTION: rash    ROS Review of Systems  Constitutional: Negative for appetite change, chills, fatigue and fever.  HENT: Negative for congestion, dental problem, ear pain and sore throat.   Eyes: Negative for discharge, redness and visual disturbance.  Respiratory: Negative for cough, chest tightness, shortness of breath and wheezing.   Cardiovascular: Negative for chest pain, palpitations and leg swelling.  Gastrointestinal: Negative for abdominal pain, blood in stool, diarrhea, nausea and vomiting.  Genitourinary: Negative for difficulty urinating, dysuria, flank pain, frequency, hematuria and urgency.  Musculoskeletal: Negative for arthralgias, back pain, joint swelling, myalgias and neck stiffness.  Skin: Negative for pallor and rash.  Neurological: Negative for dizziness, speech difficulty, weakness and headaches.  Hematological: Negative for adenopathy. Does not bruise/bleed easily.  Psychiatric/Behavioral: Negative for confusion and sleep disturbance. The patient is not nervous/anxious.     PE; Blood pressure 119/74, pulse 83, temperature 98.3 F (36.8 C), temperature source Temporal, resp. rate 16, height 5' 5.5" (1.664 m), weight 145 lb 9.6 oz (66 kg), SpO2 99 %. Body mass index is 23.86 kg/m. Pt examined with Helayne Seminole, CMA, as chaperone. Gen: Alert, well appearing.  Patient is oriented to person, place, time, and situation. AFFECT: pleasant, lucid thought and speech. ENT: Ears: EACs clear, normal epithelium.  TMs with good light reflex and landmarks bilaterally.  Eyes: no injection, icteris, swelling, or exudate.  EOMI, PERRLA. Nose: no drainage or turbinate edema/swelling.  No injection or  focal lesion.  Mouth: lips without lesion/swelling.  Oral mucosa pink and moist.  Dentition intact and without obvious caries or gingival swelling.  Oropharynx without erythema, exudate, or swelling.  Neck: supple/nontender.  No LAD, mass, or TM.  Carotid pulses 2+ bilaterally, without bruits. CV: RRR, no m/r/g.   LUNGS: CTA bilat, nonlabored resps, good aeration in all lung fields. ABD: soft, NT, ND, BS normal.  No hepatospenomegaly or mass.  No bruits. EXT: no clubbing, cyanosis, or edema.  Musculoskeletal: no joint swelling, erythema, warmth, or tenderness.  ROM of all joints intact. Skin - no sores  or suspicious lesions or rashes or color changes   Pertinent labs:  Lab Results  Component Value Date   TSH 5.26 (H) 04/27/2018   Lab Results  Component Value Date   WBC 6.2 04/27/2018   HGB 13.1 04/27/2018   HCT 38.4 04/27/2018   MCV 91.2 04/27/2018   PLT 293 04/27/2018   Lab Results  Component Value Date   CREATININE 1.00 (H) 04/27/2018   BUN 14 04/27/2018   NA 138 04/27/2018   K 4.2 04/27/2018   CL 103 04/27/2018   CO2 27 04/27/2018   Lab Results  Component Value Date   ALT 19 04/27/2018   AST 19 04/27/2018   ALKPHOS 101 04/23/2017   BILITOT 0.6 04/27/2018   Lab Results  Component Value Date   CHOL 179 04/27/2018   Lab Results  Component Value Date   HDL 68 04/27/2018   Lab Results  Component Value Date   LDLCALC 92 04/27/2018   Lab Results  Component Value Date   TRIG 94 04/27/2018   Lab Results  Component Value Date   CHOLHDL 2.6 04/27/2018     ASSESSMENT AND PLAN:   Health maintenance exam: Reviewed age and gender appropriate health maintenance issues (prudent diet, regular exercise, health risks of tobacco and excessive alcohol, use of seatbelts, fire alarms in home, use of sunscreen).  Also reviewed age and gender appropriate health screening as well as vaccine recommendations. Vaccines:  Flu vaccine-->given today.  Shingrix -->has not had this  yet, has been trying to get it for 2 yrs but was recently told by pharmacy it would cost 140$----she wants to hold off for now. Labs: fasting HP    Hep C screening->pt declined. Cervical ca screening: Dr. Willis Modena is her GYN MD->plans on f/u 11/2019. Breast ca screening: mammo UTD (12/03/18)--plan repeat 11/2019 (Dr. Willis Modena) Colon ca screening: recall 07/2023. Screening for osteoporosis/DEXA->at last CPE visit she declined this testing.  She declined it again today.  An After Visit Summary was printed and given to the patient.  FOLLOW UP:  Return in about 1 year (around 07/06/2020) for annual CPE (fasting).  Signed:  Crissie Sickles, MD           07/07/2019

## 2019-07-07 NOTE — Patient Instructions (Signed)
Health Maintenance, Female Adopting a healthy lifestyle and getting preventive care are important in promoting health and wellness. Ask your health care provider about:  The right schedule for you to have regular tests and exams.  Things you can do on your own to prevent diseases and keep yourself healthy. What should I know about diet, weight, and exercise? Eat a healthy diet   Eat a diet that includes plenty of vegetables, fruits, low-fat dairy products, and lean protein.  Do not eat a lot of foods that are high in solid fats, added sugars, or sodium. Maintain a healthy weight Body mass index (BMI) is used to identify weight problems. It estimates body fat based on height and weight. Your health care provider can help determine your BMI and help you achieve or maintain a healthy weight. Get regular exercise Get regular exercise. This is one of the most important things you can do for your health. Most adults should:  Exercise for at least 150 minutes each week. The exercise should increase your heart rate and make you sweat (moderate-intensity exercise).  Do strengthening exercises at least twice a week. This is in addition to the moderate-intensity exercise.  Spend less time sitting. Even light physical activity can be beneficial. Watch cholesterol and blood lipids Have your blood tested for lipids and cholesterol at 70 years of age, then have this test every 5 years. Have your cholesterol levels checked more often if:  Your lipid or cholesterol levels are high.  You are older than 70 years of age.  You are at high risk for heart disease. What should I know about cancer screening? Depending on your health history and family history, you may need to have cancer screening at various ages. This may include screening for:  Breast cancer.  Cervical cancer.  Colorectal cancer.  Skin cancer.  Lung cancer. What should I know about heart disease, diabetes, and high blood  pressure? Blood pressure and heart disease  High blood pressure causes heart disease and increases the risk of stroke. This is more likely to develop in people who have high blood pressure readings, are of African descent, or are overweight.  Have your blood pressure checked: ? Every 3-5 years if you are 18-39 years of age. ? Every year if you are 40 years old or older. Diabetes Have regular diabetes screenings. This checks your fasting blood sugar level. Have the screening done:  Once every three years after age 40 if you are at a normal weight and have a low risk for diabetes.  More often and at a younger age if you are overweight or have a high risk for diabetes. What should I know about preventing infection? Hepatitis B If you have a higher risk for hepatitis B, you should be screened for this virus. Talk with your health care provider to find out if you are at risk for hepatitis B infection. Hepatitis C Testing is recommended for:  Everyone born from 1945 through 1965.  Anyone with known risk factors for hepatitis C. Sexually transmitted infections (STIs)  Get screened for STIs, including gonorrhea and chlamydia, if: ? You are sexually active and are younger than 70 years of age. ? You are older than 70 years of age and your health care provider tells you that you are at risk for this type of infection. ? Your sexual activity has changed since you were last screened, and you are at increased risk for chlamydia or gonorrhea. Ask your health care provider if   you are at risk.  Ask your health care provider about whether you are at high risk for HIV. Your health care provider may recommend a prescription medicine to help prevent HIV infection. If you choose to take medicine to prevent HIV, you should first get tested for HIV. You should then be tested every 3 months for as long as you are taking the medicine. Pregnancy  If you are about to stop having your period (premenopausal) and  you may become pregnant, seek counseling before you get pregnant.  Take 400 to 800 micrograms (mcg) of folic acid every day if you become pregnant.  Ask for birth control (contraception) if you want to prevent pregnancy. Osteoporosis and menopause Osteoporosis is a disease in which the bones lose minerals and strength with aging. This can result in bone fractures. If you are 65 years old or older, or if you are at risk for osteoporosis and fractures, ask your health care provider if you should:  Be screened for bone loss.  Take a calcium or vitamin D supplement to lower your risk of fractures.  Be given hormone replacement therapy (HRT) to treat symptoms of menopause. Follow these instructions at home: Lifestyle  Do not use any products that contain nicotine or tobacco, such as cigarettes, e-cigarettes, and chewing tobacco. If you need help quitting, ask your health care provider.  Do not use street drugs.  Do not share needles.  Ask your health care provider for help if you need support or information about quitting drugs. Alcohol use  Do not drink alcohol if: ? Your health care provider tells you not to drink. ? You are pregnant, may be pregnant, or are planning to become pregnant.  If you drink alcohol: ? Limit how much you use to 0-1 drink a day. ? Limit intake if you are breastfeeding.  Be aware of how much alcohol is in your drink. In the [place] one drink equals one 12 oz bottle of beer (355 mL), one 5 oz glass of wine (148 mL), or one 1 oz glass of hard liquor (44 mL). General instructions  Schedule regular health, dental, and eye exams.  Stay current with your vaccines.  Tell your health care provider if: ? You often feel depressed. ? You have ever been abused or do not feel safe at home. Summary  Adopting a healthy lifestyle and getting preventive care are important in promoting health and wellness.  Follow your health care provider's instructions about healthy  diet, exercising, and getting tested or screened for diseases.  Follow your health care provider's instructions on monitoring your cholesterol and blood pressure. This information is not intended to replace advice given to you by your health care provider. Make sure you discuss any questions you have with your health care provider. Document Released: 04/29/2011 Document Revised: 10/07/2018 Document Reviewed: 10/07/2018 Elsevier Patient Education  2020 [employer]  

## 2019-07-08 LAB — T3: T3, Total: 140 ng/dL (ref 76–181)

## 2019-10-02 ENCOUNTER — Other Ambulatory Visit: Payer: Self-pay | Admitting: Family Medicine

## 2019-10-27 ENCOUNTER — Ambulatory Visit: Payer: Medicare Other | Attending: Internal Medicine

## 2019-10-27 DIAGNOSIS — Z20822 Contact with and (suspected) exposure to covid-19: Secondary | ICD-10-CM

## 2019-10-28 LAB — NOVEL CORONAVIRUS, NAA: SARS-CoV-2, NAA: NOT DETECTED

## 2019-12-24 ENCOUNTER — Ambulatory Visit: Payer: Medicare Other | Attending: Internal Medicine

## 2019-12-24 DIAGNOSIS — Z23 Encounter for immunization: Secondary | ICD-10-CM | POA: Insufficient documentation

## 2019-12-24 NOTE — Progress Notes (Signed)
   Covid-19 Vaccination Clinic  Name:  Teresa Roth    MRN: [id number] DOB: 12-28-1950  12/24/2019  [name] was observed post Covid-19 immunization for 15 minutes without incidence. She was provided with Vaccine Information Sheet and instruction to access the V-Safe system.   [name] was instructed to call 911 with any severe reactions post vaccine: Marland Kitchen Difficulty breathing  . Swelling of your face and throat  . A fast heartbeat  . A bad rash all over your body  . Dizziness and weakness    Immunizations Administered    Name Date Dose VIS Date Route   Pfizer COVID-19 Vaccine 12/24/2019  1:10 PM 0.3 mL 10/08/2019 Intramuscular   Manufacturer: [place]   Lot: [id number]   [place]: [id number]

## 2020-01-18 ENCOUNTER — Ambulatory Visit: Payer: Medicare Other | Attending: Internal Medicine

## 2020-01-18 DIAGNOSIS — Z23 Encounter for immunization: Secondary | ICD-10-CM

## 2020-01-18 NOTE — Progress Notes (Signed)
   Covid-19 Vaccination Clinic  Name:  NATALIYA DALRYMPLE    MRN: [id number] DOB: Nov 19, 1950  01/18/2020  Ms. Desfosses was observed post Covid-19 immunization for 15 minutes without incident. She was provided with Vaccine Information Sheet and instruction to access the V-Safe system.   Ms. Fassbender was instructed to call 911 with any severe reactions post vaccine: Marland Kitchen Difficulty breathing  . Swelling of face and throat  . A fast heartbeat  . A bad rash all over body  . Dizziness and weakness   Immunizations Administered    Name Date Dose VIS Date Route   Pfizer COVID-19 Vaccine 01/18/2020  4:02 PM 0.3 mL 10/08/2019 Intramuscular   Manufacturer: [place]   Lot: [id number]   [place]: [id number]

## 2020-04-17 DIAGNOSIS — H2513 Age-related nuclear cataract, bilateral: Secondary | ICD-10-CM | POA: Diagnosis not present

## 2020-04-17 DIAGNOSIS — H18413 Arcus senilis, bilateral: Secondary | ICD-10-CM | POA: Diagnosis not present

## 2020-04-17 DIAGNOSIS — H02839 Dermatochalasis of unspecified eye, unspecified eyelid: Secondary | ICD-10-CM | POA: Diagnosis not present

## 2020-04-17 DIAGNOSIS — H5010 Unspecified exotropia: Secondary | ICD-10-CM | POA: Diagnosis not present

## 2020-04-24 DIAGNOSIS — H5010 Unspecified exotropia: Secondary | ICD-10-CM | POA: Diagnosis not present

## 2020-04-24 DIAGNOSIS — H2513 Age-related nuclear cataract, bilateral: Secondary | ICD-10-CM | POA: Diagnosis not present

## 2020-04-24 DIAGNOSIS — H2511 Age-related nuclear cataract, right eye: Secondary | ICD-10-CM | POA: Diagnosis not present

## 2020-05-18 DIAGNOSIS — H2511 Age-related nuclear cataract, right eye: Secondary | ICD-10-CM | POA: Diagnosis not present

## 2020-05-18 DIAGNOSIS — H2512 Age-related nuclear cataract, left eye: Secondary | ICD-10-CM | POA: Diagnosis not present

## 2020-05-18 HISTORY — PX: CATARACT EXTRACTION: SUR2

## 2020-06-01 DIAGNOSIS — H52202 Unspecified astigmatism, left eye: Secondary | ICD-10-CM | POA: Diagnosis not present

## 2020-06-01 DIAGNOSIS — H2512 Age-related nuclear cataract, left eye: Secondary | ICD-10-CM | POA: Diagnosis not present

## 2020-06-01 HISTORY — PX: CATARACT EXTRACTION: SUR2

## 2020-07-07 ENCOUNTER — Ambulatory Visit (INDEPENDENT_AMBULATORY_CARE_PROVIDER_SITE_OTHER): Payer: Medicare Other | Admitting: Family Medicine

## 2020-07-07 ENCOUNTER — Encounter: Payer: Self-pay | Admitting: Family Medicine

## 2020-07-07 ENCOUNTER — Other Ambulatory Visit: Payer: Self-pay

## 2020-07-07 VITALS — BP 119/78 | HR 73 | Temp 98.0°F | Resp 16 | Ht 65.5 in | Wt 150.4 lb

## 2020-07-07 DIAGNOSIS — E038 Other specified hypothyroidism: Secondary | ICD-10-CM

## 2020-07-07 DIAGNOSIS — E039 Hypothyroidism, unspecified: Secondary | ICD-10-CM

## 2020-07-07 DIAGNOSIS — Z23 Encounter for immunization: Secondary | ICD-10-CM

## 2020-07-07 DIAGNOSIS — N183 Chronic kidney disease, stage 3 unspecified: Secondary | ICD-10-CM

## 2020-07-07 DIAGNOSIS — Z0000 Encounter for general adult medical examination without abnormal findings: Secondary | ICD-10-CM

## 2020-07-07 DIAGNOSIS — E7800 Pure hypercholesterolemia, unspecified: Secondary | ICD-10-CM

## 2020-07-07 LAB — CBC WITH DIFFERENTIAL/PLATELET
Basophils Absolute: 0.1 10*3/uL (ref 0.0–0.1)
Basophils Relative: 1.5 % (ref 0.0–3.0)
Eosinophils Absolute: 0 10*3/uL (ref 0.0–0.7)
Eosinophils Relative: 0.6 % (ref 0.0–5.0)
HCT: 38.9 % (ref 36.0–46.0)
Hemoglobin: 12.8 g/dL (ref 12.0–15.0)
Lymphocytes Relative: 22.7 % (ref 12.0–46.0)
Lymphs Abs: 1.4 10*3/uL (ref 0.7–4.0)
MCHC: 33 g/dL (ref 30.0–36.0)
MCV: 94.9 fl (ref 78.0–100.0)
Monocytes Absolute: 0.4 10*3/uL (ref 0.1–1.0)
Monocytes Relative: 6.1 % (ref 3.0–12.0)
Neutro Abs: 4.2 10*3/uL (ref 1.4–7.7)
Neutrophils Relative %: 69.1 % (ref 43.0–77.0)
Platelets: 267 10*3/uL (ref 150.0–400.0)
RBC: 4.09 Mil/uL (ref 3.87–5.11)
RDW: 13.8 % (ref 11.5–15.5)
WBC: 6.1 10*3/uL (ref 4.0–10.5)

## 2020-07-07 LAB — LIPID PANEL
Cholesterol: 185 mg/dL (ref 0–200)
HDL: 64.3 mg/dL (ref >39.00)
LDL Cholesterol: 98 mg/dL (ref 0–99)
NonHDL: 121.17
Total CHOL/HDL Ratio: 3
Triglycerides: 118 mg/dL (ref 0.0–149.0)
VLDL: 23.6 mg/dL (ref 0.0–40.0)

## 2020-07-07 LAB — COMPREHENSIVE METABOLIC PANEL
ALT: 15 U/L (ref 0–35)
AST: 11 U/L (ref 0–37)
Albumin: 4.1 g/dL (ref 3.5–5.2)
Alkaline Phosphatase: 112 U/L (ref 39–117)
BUN: 13 mg/dL (ref 6–23)
CO2: 30 mEq/L (ref 19–32)
Calcium: 9.5 mg/dL (ref 8.4–10.5)
Chloride: 104 mEq/L (ref 96–112)
Creatinine, Ser: 1.01 mg/dL (ref 0.40–1.20)
GFR: 54 mL/min — ABNORMAL LOW (ref >60.00)
Glucose, Bld: 84 mg/dL (ref 70–99)
Potassium: 4.1 mEq/L (ref 3.5–5.1)
Sodium: 140 mEq/L (ref 135–145)
Total Bilirubin: 0.7 mg/dL (ref 0.2–1.2)
Total Protein: 6.8 g/dL (ref 6.0–8.3)

## 2020-07-07 LAB — TSH: TSH: 7.85 u[IU]/mL — ABNORMAL HIGH (ref 0.35–4.50)

## 2020-07-07 LAB — T4, FREE: Free T4: 0.79 ng/dL (ref 0.60–1.60)

## 2020-07-07 NOTE — Patient Instructions (Signed)
Health Maintenance, Female Adopting a healthy lifestyle and getting preventive care are important in promoting health and wellness. Ask your health care provider about:  The right schedule for you to have regular tests and exams.  Things you can do on your own to prevent diseases and keep yourself healthy. What should I know about diet, weight, and exercise? Eat a healthy diet   Eat a diet that includes plenty of vegetables, fruits, low-fat dairy products, and lean protein.  Do not eat a lot of foods that are high in solid fats, added sugars, or sodium. Maintain a healthy weight Body mass index (BMI) is used to identify weight problems. It estimates body fat based on height and weight. Your health care provider can help determine your BMI and help you achieve or maintain a healthy weight. Get regular exercise Get regular exercise. This is one of the most important things you can do for your health. Most adults should:  Exercise for at least 150 minutes each week. The exercise should increase your heart rate and make you sweat (moderate-intensity exercise).  Do strengthening exercises at least twice a week. This is in addition to the moderate-intensity exercise.  Spend less time sitting. Even light physical activity can be beneficial. Watch cholesterol and blood lipids Have your blood tested for lipids and cholesterol at 71 years of age, then have this test every 5 years. Have your cholesterol levels checked more often if:  Your lipid or cholesterol levels are high.  You are older than 71 years of age.  You are at high risk for heart disease. What should I know about cancer screening? Depending on your health history and family history, you may need to have cancer screening at various ages. This may include screening for:  Breast cancer.  Cervical cancer.  Colorectal cancer.  Skin cancer.  Lung cancer. What should I know about heart disease, diabetes, and high blood  pressure? Blood pressure and heart disease  High blood pressure causes heart disease and increases the risk of stroke. This is more likely to develop in people who have high blood pressure readings, are of African descent, or are overweight.  Have your blood pressure checked: ? Every 3-5 years if you are 18-39 years of age. ? Every year if you are 40 years old or older. Diabetes Have regular diabetes screenings. This checks your fasting blood sugar level. Have the screening done:  Once every three years after age 40 if you are at a normal weight and have a low risk for diabetes.  More often and at a younger age if you are overweight or have a high risk for diabetes. What should I know about preventing infection? Hepatitis B If you have a higher risk for hepatitis B, you should be screened for this virus. Talk with your health care provider to find out if you are at risk for hepatitis B infection. Hepatitis C Testing is recommended for:  Everyone born from 1945 through 1965.  Anyone with known risk factors for hepatitis C. Sexually transmitted infections (STIs)  Get screened for STIs, including gonorrhea and chlamydia, if: ? You are sexually active and are younger than 71 years of age. ? You are older than 71 years of age and your health care provider tells you that you are at risk for this type of infection. ? Your sexual activity has changed since you were last screened, and you are at increased risk for chlamydia or gonorrhea. Ask your health care provider if   you are at risk.  Ask your health care provider about whether you are at high risk for HIV. Your health care provider may recommend a prescription medicine to help prevent HIV infection. If you choose to take medicine to prevent HIV, you should first get tested for HIV. You should then be tested every 3 months for as long as you are taking the medicine. Pregnancy  If you are about to stop having your period (premenopausal) and  you may become pregnant, seek counseling before you get pregnant.  Take 400 to 800 micrograms (mcg) of folic acid every day if you become pregnant.  Ask for birth control (contraception) if you want to prevent pregnancy. Osteoporosis and menopause Osteoporosis is a disease in which the bones lose minerals and strength with aging. This can result in bone fractures. If you are 65 years old or older, or if you are at risk for osteoporosis and fractures, ask your health care provider if you should:  Be screened for bone loss.  Take a calcium or vitamin D supplement to lower your risk of fractures.  Be given hormone replacement therapy (HRT) to treat symptoms of menopause. Follow these instructions at home: Lifestyle  Do not use any products that contain nicotine or tobacco, such as cigarettes, e-cigarettes, and chewing tobacco. If you need help quitting, ask your health care provider.  Do not use street drugs.  Do not share needles.  Ask your health care provider for help if you need support or information about quitting drugs. Alcohol use  Do not drink alcohol if: ? Your health care provider tells you not to drink. ? You are pregnant, may be pregnant, or are planning to become pregnant.  If you drink alcohol: ? Limit how much you use to 0-1 drink a day. ? Limit intake if you are breastfeeding.  Be aware of how much alcohol is in your drink. In the [place] one drink equals one 12 oz bottle of beer (355 mL), one 5 oz glass of wine (148 mL), or one 1 oz glass of hard liquor (44 mL). General instructions  Schedule regular health, dental, and eye exams.  Stay current with your vaccines.  Tell your health care provider if: ? You often feel depressed. ? You have ever been abused or do not feel safe at home. Summary  Adopting a healthy lifestyle and getting preventive care are important in promoting health and wellness.  Follow your health care provider's instructions about healthy  diet, exercising, and getting tested or screened for diseases.  Follow your health care provider's instructions on monitoring your cholesterol and blood pressure. This information is not intended to replace advice given to you by your health care provider. Make sure you discuss any questions you have with your health care provider. Document Revised: 10/07/2018 Document Reviewed: 10/07/2018 Elsevier Patient Education  2020 Elsevier Inc.  

## 2020-07-07 NOTE — Progress Notes (Signed)
Office Note 07/07/2020  CC: CPE  HPI:  Teresa Roth is a 69 y.o. female who is here for annual health maintenance exam and f/u CRI III, HLD, and subclinical hypothyroidism. No acute complaints.  Does some walking for exercise. Feels like she has healthy food and decent portion sizes.  HLD: tolerating statin.  CRI: drinks lots of water.  Some sprite, occ tea, no colas. Avoids NSAIDs.  Rarely uses excedrin for HA.  Past Medical History:  Diagnosis Date  . Allergy   . Anxiety   . Arthritis   . Cataract    forming-  . Chronic renal insufficiency, stage III (moderate)    CrCl 50s  . History of adenomatous polyp of colon 2008   None on rpt TCS 07/29/18->recall 07/2023.  Marland Kitchen Hyperlipidemia    Great response to atorv 40mg  qd (06/2016)  . Menopausal symptoms    venlafaxine ER 37.5mg  qd  . Psoriasis   . Strabismus    L eye; sees ophthalm  . Subclinical hypothyroidism 2018   VERY mild TSH elevation, with normal T4 and T3.  . Vasomotor flushing    postmenopausal: prempro per Dr. Waylan Rocher.    Past Surgical History:  Procedure Laterality Date  . APPENDECTOMY    . CATARACT EXTRACTION Right 05/18/2020  . CATARACT EXTRACTION Left 06/01/2020  . COLONOSCOPY  2014;  07/29/18   No polyps 07/2018.  Anal papilla(e) were hypertrophied.  Recall 07/2023.  Marland Kitchen COLONOSCOPY W/ POLYPECTOMY  03/08/07   Adenomatous polyp-  . POLYPECTOMY    . TUBAL LIGATION     1978    Family History  Problem Relation Age of Onset  . Diabetes Mother   . Stomach cancer Mother   . Heart failure Father   . Diabetes Father   . Colon polyps Father   . Colon cancer Neg Hx   . Esophageal cancer Neg Hx   . Rectal cancer Neg Hx     Social History   Socioeconomic History  . Marital status: Married    Spouse name: Not on file  . Number of children: Not on file  . Years of education: Not on file  . Highest education level: Not on file  Occupational History  . Not on file  Tobacco Use  . Smoking  status: Never Smoker  . Smokeless tobacco: Never Used  Vaping Use  . Vaping Use: Never used  Substance and Sexual Activity  . Alcohol use: No  . Drug use: No  . Sexual activity: Not on file  Other Topics Concern  . Not on file  Social History Narrative   Married, one son.   Orig from [place] area.   Occupation: retired from [employer] and [employer].   No tob, no alc.   Social Determinants of Health   Financial Resource Strain:   . Difficulty of Paying Living Expenses: Not on file  Food Insecurity:   . Worried About Charity fundraiser in the Last Year: Not on file  . Ran Out of Food in the Last Year: Not on file  Transportation Needs:   . Lack of Transportation (Medical): Not on file  . Lack of Transportation (Non-Medical): Not on file  Physical Activity:   . Days of Exercise per Week: Not on file  . Minutes of Exercise per Session: Not on file  Stress:   . Feeling of Stress : Not on file  Social Connections:   . Frequency of Communication with Friends and Family: Not on file  .  Frequency of Social Gatherings with Friends and Family: Not on file  . Attends Religious Services: Not on file  . Active Member of Clubs or Organizations: Not on file  . Attends Archivist Meetings: Not on file  . Marital Status: Not on file  Intimate Partner Violence:   . Fear of Current or Ex-Partner: Not on file  . Emotionally Abused: Not on file  . Physically Abused: Not on file  . Sexually Abused: Not on file    Outpatient Medications Prior to Visit  Medication Sig Dispense Refill  . atorvastatin (LIPITOR) 40 MG tablet TAKE 1 TABLET BY MOUTH  DAILY. OFFICE VISIT NEEDED 90 tablet 3  . pseudoephedrine-acetaminophen (TYLENOL SINUS) 30-500 MG TABS tablet Take 1 tablet by mouth every 4 (four) hours as needed.    . venlafaxine XR (EFFEXOR-XR) 37.5 MG 24 hr capsule Take 1 capsule (37.5 mg total) by mouth daily. 90 capsule 1  . acetaminophen (TYLENOL) 500 MG tablet Take 1,000 mg by mouth every  6 (six) hours as needed. (Patient not taking: Reported on 07/07/2020)    . Aspirin-Salicylamide-Caffeine (BC HEADACHE POWDER PO) Take by mouth as needed. (Patient not taking: Reported on 07/07/2020)    . fluticasone (CUTIVATE) 0.05 % cream Apply topically 2 (two) times daily. (Patient not taking: Reported on 07/07/2019) 90 g 1  . Influenza vac split quadrivalent PF (FLUZONE HIGH-DOSE) 0.5 ML injection Fluzone High-Dose 2018-2019 (PF) 180 mcg/0.5 mL intramuscular syringe  TO BE ADMINISTERED BY PHARMACIST FOR IMMUNIZATION (Patient not taking: Reported on 07/07/2020)     Facility-Administered Medications Prior to Visit  Medication Dose Route Frequency Provider Last Rate Last Admin  . 0.9 %  sodium chloride infusion  500 mL Intravenous Once Ladene Artist, MD        Allergies  Allergen Reactions  . Sulfamethoxazole     REACTION: rash    ROS Review of Systems  Constitutional: Negative for appetite change, chills, fatigue and fever.  HENT: Negative for congestion, dental problem, ear pain and sore throat.   Eyes: Negative for discharge, redness and visual disturbance.  Respiratory: Negative for cough, chest tightness, shortness of breath and wheezing.   Cardiovascular: Negative for chest pain, palpitations and leg swelling.  Gastrointestinal: Negative for abdominal pain, blood in stool, diarrhea, nausea and vomiting.  Genitourinary: Negative for difficulty urinating, dysuria, flank pain, frequency, hematuria and urgency.  Musculoskeletal: Negative for arthralgias, back pain, joint swelling, myalgias and neck stiffness.  Skin: Negative for pallor and rash.  Neurological: Negative for dizziness, speech difficulty, weakness and headaches.  Hematological: Negative for adenopathy. Does not bruise/bleed easily.  Psychiatric/Behavioral: Negative for confusion and sleep disturbance. The patient is not nervous/anxious.     PE; Vitals with BMI 07/07/2020 07/07/2019 07/29/2018  Height 5' 5.5" 5' 5.5" -   Weight 150 lbs 6 oz 145 lbs 10 oz -  BMI 17.49 44.96 -  Systolic 759 163 846  Diastolic 78 74 63  Pulse 73 83 68    Exam chaperoned by Lonie Peak Gen: Alert, well appearing.  Patient is oriented to person, place, time, and situation. AFFECT: pleasant, lucid thought and speech. ENT: Ears: EACs clear, normal epithelium.  TMs with good light reflex and landmarks bilaterally.  Eyes: no injection, icteris, swelling, or exudate.  EOMI, PERRLA. Nose: no drainage or turbinate edema/swelling.  No injection or focal lesion.  Mouth: lips without lesion/swelling.  Oral mucosa pink and moist.  Dentition intact and without obvious caries or gingival swelling.  Oropharynx without  erythema, exudate, or swelling.  Neck: supple/nontender.  No LAD, mass, or TM.  Carotid pulses 2+ bilaterally, without bruits. CV: RRR, no m/r/g.   LUNGS: CTA bilat, nonlabored resps, good aeration in all lung fields. ABD: soft, NT, ND, BS normal.  No hepatospenomegaly or mass.  No bruits. EXT: no clubbing, cyanosis, or edema.  Musculoskeletal: no joint swelling, erythema, warmth, or tenderness.  ROM of all joints intact. Skin - no sores or suspicious lesions or rashes or color changes   Pertinent labs:  Lab Results  Component Value Date   TSH 5.20 (H) 07/07/2019   T3TOTAL 140 07/07/2019    Lab Results  Component Value Date   WBC 5.8 07/07/2019   HGB 12.7 07/07/2019   HCT 38.6 07/07/2019   MCV 94.5 07/07/2019   PLT 285.0 07/07/2019   Lab Results  Component Value Date   CREATININE 0.92 07/07/2019   BUN 13 07/07/2019   NA 139 07/07/2019   K 4.6 07/07/2019   CL 104 07/07/2019   CO2 27 07/07/2019   Lab Results  Component Value Date   ALT 17 07/07/2019   AST 15 07/07/2019   ALKPHOS 106 07/07/2019   BILITOT 0.7 07/07/2019   Lab Results  Component Value Date   CHOL 170 07/07/2019   Lab Results  Component Value Date   HDL 61.40 07/07/2019   Lab Results  Component Value Date   LDLCALC 91  07/07/2019   Lab Results  Component Value Date   TRIG 86.0 07/07/2019   Lab Results  Component Value Date   CHOLHDL 3 07/07/2019    ASSESSMENT AND PLAN:    1) HLD: tolerating statin, working on diet but needs to exercise [facility] MORE. FLP and hepatic panel today.  2) CRI III: avoids NSAIDs, hydrates well. Lytes/cr today.  3) subclinical hypothyroidism: monitor TSH and free T4 and T3 total today.  4) Health maintenance exam: Reviewed age and gender appropriate health maintenance issues (prudent diet, regular exercise, health risks of tobacco and excessive alcohol, use of seatbelts, fire alarms in home, use of sunscreen).  Also reviewed age and gender appropriate health screening as well as vaccine recommendations. Vaccines:  Covid UTD.  Flu vaccine-->will get today.  Shingrix -->has not had this yet, has been trying to get it for 2 yrs but was recently told by pharmacy it would cost 140$----she wants to hold off for now. Labs: fasting HP + free T4 and T3 total (HLD, CRI, subclin hypoth).    Pt has declined Hep C and HIV screening. Cervical ca screening: Dr. Willis Modena is her GYN MD, has not followed up in a couple years->plans on f/u->plans on f/u with him before 2022. Breast ca screening: mammo UTD (12/03/18)--plan was to repeat 11/2019 (Dr. Willis Modena) Colon ca screening: recall 07/2023. Screening for osteoporosis/DEXA->at last CPE visit she declined this testing.  She continues to decline this screening.  An After Visit Summary was printed and given to the patient.  FOLLOW UP:  Return in about 1 year (around 07/07/2021) for annual CPE (fasting).  Signed:  Crissie Sickles, MD           07/07/2020

## 2020-07-07 NOTE — Addendum Note (Signed)
Addended by: Deveron Furlong D on: 07/07/2020 10:12 AM   Modules accepted: Orders

## 2020-07-08 LAB — T3: T3, Total: 124 ng/dL (ref 76–181)

## 2020-08-01 NOTE — Progress Notes (Signed)
Subjective:   Teresa Roth is a 69 y.o. female who presents for Medicare Annual (Subsequent) preventive examination.  I connected with Elana today by telephone and verified that I am speaking with the correct person using two identifiers. Location patient: home Location provider: work Persons participating in the virtual visit: patient, Marine scientist.    I discussed the limitations, risks, security and privacy concerns of performing an evaluation and management service by telephone and the availability of in person appointments. I also discussed with the patient that there may be a patient responsible charge related to this service. The patient expressed understanding and verbally consented to this telephonic visit.    Interactive audio and video telecommunications were attempted between this provider and patient, however failed, due to patient having technical difficulties OR patient did not have access to video capability.  We continued and completed visit with audio only.  Some vital signs may be absent or patient reported.   Time Spent with patient on telephone encounter: 25 minutes  Review of Systems     Cardiac Risk Factors include: advanced age (>88men, >16 women);dyslipidemia     Objective:    Today's Vitals   08/02/20 1242  Weight: 145 lb (65.8 kg)  Height: 5\' 5"  (1.651 m)   Body mass index is 24.13 kg/m.  Advanced Directives 08/02/2020 07/29/2018 04/27/2018  Does Patient Have a Medical Advance Directive? Yes Yes Yes  Type of Paramedic of [place];Living will [place];Living will Living will;Healthcare Power of [place] in Chart? No - copy requested - No - copy requested    Current Medications (verified) Outpatient Encounter Medications as of 08/02/2020  Medication Sig  . atorvastatin (LIPITOR) 40 MG tablet TAKE 1 TABLET BY MOUTH  DAILY. OFFICE VISIT NEEDED  . cetirizine (ZYRTEC) 10 MG  tablet Take 10 mg by mouth daily.  Marland Kitchen venlafaxine XR (EFFEXOR-XR) 37.5 MG 24 hr capsule Take 1 capsule (37.5 mg total) by mouth daily.  . vitamin B-12 (CYANOCOBALAMIN) 500 MCG tablet Take 500 mcg by mouth daily.  Marland Kitchen acetaminophen (TYLENOL) 500 MG tablet Take 1,000 mg by mouth every 6 (six) hours as needed. (Patient not taking: Reported on 07/07/2020)  . Aspirin-Salicylamide-Caffeine (BC HEADACHE POWDER PO) Take by mouth as needed. (Patient not taking: Reported on 07/07/2020)  . fluticasone (CUTIVATE) 0.05 % cream Apply topically 2 (two) times daily. (Patient not taking: Reported on 07/07/2019)  . pseudoephedrine-acetaminophen (TYLENOL SINUS) 30-500 MG TABS tablet Take 1 tablet by mouth every 4 (four) hours as needed. (Patient not taking: Reported on 08/02/2020)   Facility-Administered Encounter Medications as of 08/02/2020  Medication  . 0.9 %  sodium chloride infusion    Allergies (verified) Sulfamethoxazole   History: Past Medical History:  Diagnosis Date  . Allergy   . Anxiety   . Arthritis   . Cataract    forming-  . Chronic renal insufficiency, stage III (moderate) (HCC)    CrCl 50s  . History of adenomatous polyp of colon 2008   None on rpt TCS 07/29/18->recall 07/2023.  Marland Kitchen Hyperlipidemia    Great response to atorv 40mg  qd (06/2016)  . Menopausal symptoms    venlafaxine ER 37.5mg  qd  . Psoriasis   . Strabismus    L eye; sees ophthalm  . Subclinical hypothyroidism 2018   VERY mild TSH elevation, with normal T4 and T3.  . Vasomotor flushing    postmenopausal: prempro per Dr. Waylan Rocher.   Past Surgical History:  Procedure Laterality Date  . APPENDECTOMY    . CATARACT EXTRACTION Right 05/18/2020  . CATARACT EXTRACTION Left 06/01/2020  . COLONOSCOPY  2014;  07/29/18   No polyps 07/2018.  Anal papilla(e) were hypertrophied.  Recall 07/2023.  Marland Kitchen COLONOSCOPY W/ POLYPECTOMY  03/08/07   Adenomatous polyp-  . POLYPECTOMY    . TUBAL LIGATION     1978   Family History  Problem  Relation Age of Onset  . Diabetes Mother   . Stomach cancer Mother   . Heart failure Father   . Diabetes Father   . Colon polyps Father   . Colon cancer Neg Hx   . Esophageal cancer Neg Hx   . Rectal cancer Neg Hx    Social History   Socioeconomic History  . Marital status: Married    Spouse name: Not on file  . Number of children: Not on file  . Years of education: Not on file  . Highest education level: Not on file  Occupational History  . Occupation: retired  Tobacco Use  . Smoking status: Never Smoker  . Smokeless tobacco: Never Used  Vaping Use  . Vaping Use: Never used  Substance and Sexual Activity  . Alcohol use: No  . Drug use: No  . Sexual activity: Not on file  Other Topics Concern  . Not on file  Social History Narrative   Married, one son.   Orig from [place] area.   Occupation: retired from [employer] and [employer].   No tob, no alc.   Social Determinants of Health   Financial Resource Strain: Low Risk   . Difficulty of Paying Living Expenses: Not hard at all  Food Insecurity: No Food Insecurity  . Worried About Charity fundraiser in the Last Year: Never true  . Ran Out of Food in the Last Year: Never true  Transportation Needs: No Transportation Needs  . Lack of Transportation (Medical): No  . Lack of Transportation (Non-Medical): No  Physical Activity: Insufficiently Active  . Days of Exercise per Week: 4 days  . Minutes of Exercise per Session: 30 min  Stress: No Stress Concern Present  . Feeling of Stress : Not at all  Social Connections: Moderately Isolated  . Frequency of Communication with Friends and Family: More than three times a week  . Frequency of Social Gatherings with Friends and Family: More than three times a week  . Attends Religious Services: Never  . Active Member of Clubs or Organizations: No  . Attends Archivist Meetings: Never  . Marital Status: Married    Tobacco Counseling Counseling given: Not  Answered   Clinical Intake:  Pre-visit preparation completed: Yes  Pain : No/denies pain     Nutritional Status: BMI of 19-24  Normal Nutritional Risks: None Diabetes: No  How often do you need to have someone help you when you read instructions, pamphlets, or other written materials from your doctor or pharmacy?: 1 - Never What is the last grade level you completed in school?: some college  Diabetic?No  Interpreter Needed?: No  Information entered by :: Caroleen Hamman LPN   Activities of Daily Living In your present state of health, do you have any difficulty performing the following activities: 08/02/2020 07/07/2020  Hearing? N N  Vision? N N  Difficulty concentrating or making decisions? N N  Walking or climbing stairs? N N  Dressing or bathing? N N  Doing errands, shopping? N N  Preparing Food and eating ? N -  Using the Toilet? N -  In the past six months, have you accidently leaked urine? N -  Do you have problems with loss of bowel control? N -  Managing your Medications? N -  Managing your Finances? N -  Housekeeping or managing your Housekeeping? N -  Some recent data might be hidden    Patient Care Team: Tammi Sou, MD as PCP - General (Family Medicine) Meisinger, Sherren Mocha, MD as Consulting Physician (Obstetrics and Gynecology) Darleen Crocker, MD as Consulting Physician (Ophthalmology) Ladene Artist, MD as Consulting Physician (Gastroenterology)  Indicate any recent Medical Services you may have received from other than [facility] providers in the past year (date may be approximate).     Assessment:   This is a routine wellness examination for Sarh.  Hearing/Vision screen  Hearing Screening   125Hz  250Hz  500Hz  1000Hz  2000Hz  3000Hz  4000Hz  6000Hz  8000Hz   Right ear:           Left ear:           Comments: No issues  Vision Screening Comments: Reading glasses Last eye exam-2021-Dr. Sun  Dietary issues and exercise activities discussed: Current  Exercise Habits: Home exercise routine, Type of exercise: walking, Time (Minutes): 30, Frequency (Times/Week): 4, Weekly Exercise (Minutes/Week): 120, Intensity: Mild, Exercise limited by: None identified  Goals    . Weight (lb) < 135 lb (61.2 kg)     Lose weight by decreasing carb intake.       Depression Screen PHQ 2/9 Scores 08/02/2020 04/27/2018 04/27/2018 04/22/2016 04/06/2015  PHQ - 2 Score 0 0 0 0 2  PHQ- 9 Score - 2 - - 8    Fall Risk Fall Risk  08/02/2020 07/07/2020 04/27/2018 04/22/2016 04/06/2015  Falls in the past year? 0 0 No No No  Number falls in past yr: 0 0 - - -  Injury with Fall? 0 0 - - -  Follow up Falls prevention discussed Falls evaluation completed - - -    Any stairs in or around the home? No  Home free of loose throw rugs in walkways, pet beds, electrical cords, etc? Yes  Adequate lighting in your home to reduce risk of falls? Yes   ASSISTIVE DEVICES UTILIZED TO PREVENT FALLS:  Life alert? No  Use of a cane, walker or w/c? No  Grab bars in the bathroom? Yes  Shower chair or bench in shower? No  Elevated toilet seat or a handicapped toilet? No   TIMED UP AND GO:  Was the test performed? No . Phone visit   Cognitive Function:No cognitive impairment noted.  MMSE - Mini Mental State Exam 04/27/2018  Orientation to time 5  Orientation to Place 5  Registration 3  Attention/ Calculation 5  Recall 1  Language- name 2 objects 2  Language- repeat 1  Language- follow 3 step command 3  Language- read & follow direction 1  Write a sentence 1  Copy design 1  Total score 28     6CIT Screen 08/02/2020  What Year? 0 points  What month? 0 points  What time? 0 points  Count back from 20 0 points  Months in reverse 0 points  Repeat phrase 0 points  Total Score 0    Immunizations Immunization History  Administered Date(s) Administered  . Fluad Quad(high Dose 65+) 07/07/2019, 07/07/2020  . Influenza Split 10/14/2012  . Influenza, High Dose Seasonal PF  08/20/2018  . Influenza-Unspecified 08/23/2017  . PFIZER SARS-COV-2 Vaccination 12/24/2019, 01/18/2020  . Pneumococcal Conjugate-13  04/06/2015  . Pneumococcal Polysaccharide-23 04/22/2016  . Td 05/21/2007  . Tdap 06/01/2018  . Zoster 04/10/2011    TDAP status: Up to date   Flu Vaccine status: Up to date   Pneumococcal vaccine status: Up to date   Covid-19 vaccine status: Completed vaccines  Qualifies for Shingles Vaccine? Yes   Zostavax completed Yes   Shingrix Completed?: No.    Education has been provided regarding the importance of this vaccine. Patient has been advised to call insurance company to determine out of pocket expense if they have not yet received this vaccine. Advised may also receive vaccine at local pharmacy or Health Dept. Verbalized acceptance and understanding.  Screening Tests Health Maintenance  Topic Date Due  . MAMMOGRAM  12/04/2019  . COLONOSCOPY  07/30/2023  . TETANUS/TDAP  06/01/2028  . INFLUENZA VACCINE  Completed  . COVID-19 Vaccine  Completed  . PNA vac Low Risk Adult  Completed  . DEXA SCAN  Discontinued  . Hepatitis C Screening  Discontinued    Health Maintenance  Health Maintenance Due  Topic Date Due  . MAMMOGRAM  12/04/2019    Colorectal cancer screening: Completed 07/29/2018. Repeat every 5 years   Mammogram status: Patient plans to have done at GYN office. Requested patient have copy of results sent to PCP.  Bone Density Status: Patient declined.  Lung Cancer Screening: (Low Dose CT Chest recommended if Age 31-80 years, 30 pack-year currently smoking OR have quit w/in 15years.) does not qualify.     Additional Screening:  Hepatitis C Screening: does qualify; Discuss with PCP  Vision Screening: Recommended annual ophthalmology exams for early detection of glaucoma and other disorders of the eye. Is the patient up to date with their annual eye exam?  Yes  Who is the provider or what is the name of the office in which the  patient attends annual eye exams? Dr. Nancy Fetter   Dental Screening: Recommended annual dental exams for proper oral hygiene  Community Resource Referral / Chronic Care Management: CRR required this visit?  No   CCM required this visit?  No      Plan:     I have personally reviewed and noted the following in the patient's chart:   . Medical and social history . Use of alcohol, tobacco or illicit drugs  . Current medications and supplements . Functional ability and status . Nutritional status . Physical activity . Advanced directives . List of other physicians . Hospitalizations, surgeries, and ER visits in previous 12 months . Vitals . Screenings to include cognitive, depression, and falls . Referrals and appointments  In addition, I have reviewed and discussed with patient certain preventive protocols, quality metrics, and best practice recommendations. A written personalized care plan for preventive services as well as general preventive health recommendations were provided to patient.    Due to this being a telephonic visit, the after visit summary with patients personalized plan was offered to patient via mail or my-chart.Patient would like to access on my-chart.   Marta Antu, LPN   [id number]  Nurse Health Advisor  Nurse Notes: None

## 2020-08-02 ENCOUNTER — Telehealth: Payer: Self-pay

## 2020-08-02 ENCOUNTER — Ambulatory Visit (INDEPENDENT_AMBULATORY_CARE_PROVIDER_SITE_OTHER): Payer: Medicare Other

## 2020-08-02 VITALS — Ht 65.0 in | Wt 145.0 lb

## 2020-08-02 DIAGNOSIS — Z0000 Encounter for general adult medical examination without abnormal findings: Secondary | ICD-10-CM | POA: Diagnosis not present

## 2020-08-02 NOTE — Telephone Encounter (Signed)
Reassure pt that the TSH being a little higher is okay. I checked her actual thyroid hormone levels and they are normal. I am following these labs closely with her and will let her know if we need to do anything different.-thx

## 2020-08-02 NOTE — Telephone Encounter (Signed)
Patient advised and voiced understanding.  

## 2020-08-02 NOTE — Patient Instructions (Signed)
[name] , Thank you for taking time to complete your Medicare Wellness Visit. I appreciate your ongoing commitment to your health goals. Please review the following plan we discussed and let me know if I can assist you in the future.   Screening recommendations/referrals: Colonoscopy: Completed 07/29/2018- Due-07/30/2023 Mammogram: Per our conversation today, to be scheduled at GYN office. Please have copy of results sent to Dr. Anitra Roth. Bone Density: Declined at [facility] time. Please call the office to schedule if you change your mind. Recommended yearly ophthalmology/optometry visit for glaucoma screening and checkup Recommended yearly dental visit for hygiene and checkup  Vaccinations: Influenza vaccine: Up to Date Pneumococcal vaccine: Completed vaccines Tdap vaccine: Up to Date- Due-06/01/2028 Shingles vaccine: discuss with pharmacy Covid-19:Completed vaccines  Advanced directives: Please bring a copy for your chart.  Conditions/risks identified: See problem list  Next appointment: Follow up in one year for your annual wellness visit 08/08/2021 @ 12:45   Preventive Care 65 Years and Older, Female Preventive care refers to lifestyle choices and visits with your health care provider that can promote health and wellness. What does preventive care include?  A yearly physical exam. This is also called an annual well check.  Dental exams once or twice a year.  Routine eye exams. Ask your health care provider how often you should have your eyes checked.  Personal lifestyle choices, including:  Daily care of your teeth and gums.  Regular physical activity.  Eating a healthy diet.  Avoiding tobacco and drug use.  Limiting alcohol use.  Practicing safe sex.  Taking low-dose aspirin every day.  Taking vitamin and mineral supplements as recommended by your health care provider. What happens during an annual well check? The services and screenings done by your health care  provider during your annual well check will depend on your age, overall health, lifestyle risk factors, and family history of disease. Counseling  Your health care provider may ask you questions about your:  Alcohol use.  Tobacco use.  Drug use.  Emotional well-being.  Home and relationship well-being.  Sexual activity.  Eating habits.  History of falls.  Memory and ability to understand (cognition).  Work and work Statistician.  Reproductive health. Screening  You may have the following tests or measurements:  Height, weight, and BMI.  Blood pressure.  Lipid and cholesterol levels. These may be checked every 5 years, or more frequently if you are over 50 years old.  Skin check.  Lung cancer screening. You may have this screening every year starting at age 78 if you have a 30-pack-year history of smoking and currently smoke or have quit within the past 15 years.  Fecal occult blood test (FOBT) of the stool. You may have this test every year starting at age 53.  Flexible sigmoidoscopy or colonoscopy. You may have a sigmoidoscopy every 5 years or a colonoscopy every 10 years starting at age 66.  Hepatitis C blood test.  Hepatitis B blood test.  Sexually transmitted disease (STD) testing.  Diabetes screening. This is done by checking your blood sugar (glucose) after you have not eaten for a while (fasting). You may have this done every 1-3 years.  Bone density scan. This is done to screen for osteoporosis. You may have this done starting at age 49.  Mammogram. This may be done every 1-2 years. Talk to your health care provider about how often you should have regular mammograms. Talk with your health care provider about your test results, treatment options, and if  necessary, the need for more tests. Vaccines  Your health care provider may recommend certain vaccines, such as:  Influenza vaccine. This is recommended every year.  Tetanus, diphtheria, and acellular  pertussis (Tdap, Td) vaccine. You may need a Td booster every 10 years.  Zoster vaccine. You may need this after age 47.  Pneumococcal 13-valent conjugate (PCV13) vaccine. One dose is recommended after age 57.  Pneumococcal polysaccharide (PPSV23) vaccine. One dose is recommended after age 72. Talk to your health care provider about which screenings and vaccines you need and how often you need them. This information is not intended to replace advice given to you by your health care provider. Make sure you discuss any questions you have with your health care provider. Document Released: 11/10/2015 Document Revised: 07/03/2016 Document Reviewed: 08/15/2015 Elsevier Interactive Patient Education  2017 [place] Prevention in the Home Falls can cause injuries. They can happen to people of all ages. There are many things you can do to make your home safe and to help prevent falls. What can I do on the outside of my home?  Regularly fix the edges of walkways and driveways and fix any cracks.  Remove anything that might make you trip as you walk through a door, such as a raised step or threshold.  Trim any bushes or trees on the path to your home.  Use bright outdoor lighting.  Clear any walking paths of anything that might make someone trip, such as rocks or tools.  Regularly check to see if handrails are loose or broken. Make sure that both sides of any steps have handrails.  Any raised decks and porches should have guardrails on the edges.  Have any leaves, snow, or ice cleared regularly.  Use sand or salt on walking paths during winter.  Clean up any spills in your garage right away. This includes oil or grease spills. What can I do in the bathroom?  Use night lights.  Install grab bars by the toilet and in the tub and shower. Do not use towel bars as grab bars.  Use non-skid mats or decals in the tub or shower.  If you need to sit down in the shower, use a plastic,  non-slip stool.  Keep the floor dry. Clean up any water that spills on the floor as soon as it happens.  Remove soap buildup in the tub or shower regularly.  Attach bath mats securely with double-sided non-slip rug tape.  Do not have throw rugs and other things on the floor that can make you trip. What can I do in the bedroom?  Use night lights.  Make sure that you have a light by your bed that is easy to reach.  Do not use any sheets or blankets that are too big for your bed. They should not hang down onto the floor.  Have a firm chair that has side arms. You can use this for support while you get dressed.  Do not have throw rugs and other things on the floor that can make you trip. What can I do in the kitchen?  Clean up any spills right away.  Avoid walking on wet floors.  Keep items that you use a lot in easy-to-reach places.  If you need to reach something above you, use a strong step stool that has a grab bar.  Keep electrical cords out of the way.  Do not use floor polish or wax that makes floors slippery. If you must  use wax, use non-skid floor wax.  Do not have throw rugs and other things on the floor that can make you trip. What can I do with my stairs?  Do not leave any items on the stairs.  Make sure that there are handrails on both sides of the stairs and use them. Fix handrails that are broken or loose. Make sure that handrails are as long as the stairways.  Check any carpeting to make sure that it is firmly attached to the stairs. Fix any carpet that is loose or worn.  Avoid having throw rugs at the top or bottom of the stairs. If you do have throw rugs, attach them to the floor with carpet tape.  Make sure that you have a light switch at the top of the stairs and the bottom of the stairs. If you do not have them, ask someone to add them for you. What else can I do to help prevent falls?  Wear shoes that:  Do not have high heels.  Have rubber  bottoms.  Are comfortable and fit you well.  Are closed at the toe. Do not wear sandals.  If you use a stepladder:  Make sure that it is fully opened. Do not climb a closed stepladder.  Make sure that both sides of the stepladder are locked into place.  Ask someone to hold it for you, if possible.  Clearly mark and make sure that you can see:  Any grab bars or handrails.  First and last steps.  Where the edge of each step is.  Use tools that help you move around (mobility aids) if they are needed. These include:  Canes.  Walkers.  Scooters.  Crutches.  Turn on the lights when you go into a dark area. Replace any light bulbs as soon as they burn out.  Set up your furniture so you have a clear path. Avoid moving your furniture around.  If any of your floors are uneven, fix them.  If there are any pets around you, be aware of where they are.  Review your medicines with your doctor. Some medicines can make you feel dizzy. This can increase your chance of falling. Ask your doctor what other things that you can do to help prevent falls. This information is not intended to replace advice given to you by your health care provider. Make sure you discuss any questions you have with your health care provider. Document Released: 08/10/2009 Document Revised: 03/21/2016 Document Reviewed: 11/18/2014 Elsevier Interactive Patient Education  2017 [employer].

## 2020-08-02 NOTE — Telephone Encounter (Signed)
During medicare wellness visit, patient had a question about her TSH results. She says she received a message stating all of her labs are normal but she saw on mychart that her TSH was more elevated than it was last year & she wants to know if that is ok?

## 2020-09-21 ENCOUNTER — Other Ambulatory Visit: Payer: Self-pay | Admitting: Family Medicine

## 2020-12-05 DIAGNOSIS — Z1231 Encounter for screening mammogram for malignant neoplasm of breast: Secondary | ICD-10-CM | POA: Diagnosis not present

## 2020-12-06 LAB — HM MAMMOGRAPHY

## 2021-02-07 ENCOUNTER — Other Ambulatory Visit: Payer: Self-pay | Admitting: Family Medicine

## 2021-05-28 DIAGNOSIS — H26492 Other secondary cataract, left eye: Secondary | ICD-10-CM | POA: Diagnosis not present

## 2021-05-28 DIAGNOSIS — H43812 Vitreous degeneration, left eye: Secondary | ICD-10-CM | POA: Diagnosis not present

## 2021-05-28 DIAGNOSIS — H5010 Unspecified exotropia: Secondary | ICD-10-CM | POA: Diagnosis not present

## 2021-05-28 DIAGNOSIS — Z961 Presence of intraocular lens: Secondary | ICD-10-CM | POA: Diagnosis not present

## 2021-06-04 DIAGNOSIS — H43812 Vitreous degeneration, left eye: Secondary | ICD-10-CM | POA: Diagnosis not present

## 2021-06-04 DIAGNOSIS — H5010 Unspecified exotropia: Secondary | ICD-10-CM | POA: Diagnosis not present

## 2021-06-04 DIAGNOSIS — Z961 Presence of intraocular lens: Secondary | ICD-10-CM | POA: Diagnosis not present

## 2021-07-09 ENCOUNTER — Encounter: Payer: Medicare Other | Admitting: Family Medicine

## 2021-07-18 ENCOUNTER — Other Ambulatory Visit: Payer: Self-pay

## 2021-07-18 DIAGNOSIS — L820 Inflamed seborrheic keratosis: Secondary | ICD-10-CM | POA: Diagnosis not present

## 2021-07-18 DIAGNOSIS — D485 Neoplasm of uncertain behavior of skin: Secondary | ICD-10-CM | POA: Diagnosis not present

## 2021-07-18 DIAGNOSIS — L570 Actinic keratosis: Secondary | ICD-10-CM | POA: Diagnosis not present

## 2021-07-18 DIAGNOSIS — L728 Other follicular cysts of the skin and subcutaneous tissue: Secondary | ICD-10-CM | POA: Diagnosis not present

## 2021-07-18 DIAGNOSIS — L821 Other seborrheic keratosis: Secondary | ICD-10-CM | POA: Diagnosis not present

## 2021-07-18 DIAGNOSIS — C4490 Unspecified malignant neoplasm of skin, unspecified: Secondary | ICD-10-CM | POA: Diagnosis not present

## 2021-07-19 ENCOUNTER — Ambulatory Visit (INDEPENDENT_AMBULATORY_CARE_PROVIDER_SITE_OTHER): Payer: Medicare Other | Admitting: Family Medicine

## 2021-07-19 ENCOUNTER — Encounter: Payer: Self-pay | Admitting: Family Medicine

## 2021-07-19 VITALS — BP 123/73 | HR 78 | Temp 97.7°F | Ht 65.75 in | Wt 146.2 lb

## 2021-07-19 DIAGNOSIS — E038 Other specified hypothyroidism: Secondary | ICD-10-CM | POA: Diagnosis not present

## 2021-07-19 DIAGNOSIS — Z23 Encounter for immunization: Secondary | ICD-10-CM | POA: Diagnosis not present

## 2021-07-19 DIAGNOSIS — Z0000 Encounter for general adult medical examination without abnormal findings: Secondary | ICD-10-CM | POA: Diagnosis not present

## 2021-07-19 DIAGNOSIS — N183 Chronic kidney disease, stage 3 unspecified: Secondary | ICD-10-CM

## 2021-07-19 DIAGNOSIS — E7800 Pure hypercholesterolemia, unspecified: Secondary | ICD-10-CM

## 2021-07-19 LAB — CBC WITH DIFFERENTIAL/PLATELET
Basophils Absolute: 0 10*3/uL (ref 0.0–0.1)
Basophils Relative: 0.8 % (ref 0.0–3.0)
Eosinophils Absolute: 0.1 10*3/uL (ref 0.0–0.7)
Eosinophils Relative: 1.2 % (ref 0.0–5.0)
HCT: 37.9 % (ref 36.0–46.0)
Hemoglobin: 12.4 g/dL (ref 12.0–15.0)
Lymphocytes Relative: 27 % (ref 12.0–46.0)
Lymphs Abs: 1.5 10*3/uL (ref 0.7–4.0)
MCHC: 32.7 g/dL (ref 30.0–36.0)
MCV: 94 fl (ref 78.0–100.0)
Monocytes Absolute: 0.4 10*3/uL (ref 0.1–1.0)
Monocytes Relative: 6.9 % (ref 3.0–12.0)
Neutro Abs: 3.5 10*3/uL (ref 1.4–7.7)
Neutrophils Relative %: 64.1 % (ref 43.0–77.0)
Platelets: 274 10*3/uL (ref 150.0–400.0)
RBC: 4.03 Mil/uL (ref 3.87–5.11)
RDW: 13.9 % (ref 11.5–15.5)
WBC: 5.4 10*3/uL (ref 4.0–10.5)

## 2021-07-19 LAB — COMPREHENSIVE METABOLIC PANEL
ALT: 17 U/L (ref 0–35)
AST: 16 U/L (ref 0–37)
Albumin: 4 g/dL (ref 3.5–5.2)
Alkaline Phosphatase: 103 U/L (ref 39–117)
BUN: 11 mg/dL (ref 6–23)
CO2: 28 mEq/L (ref 19–32)
Calcium: 9.4 mg/dL (ref 8.4–10.5)
Chloride: 104 mEq/L (ref 96–112)
Creatinine, Ser: 1.02 mg/dL (ref 0.40–1.20)
GFR: 55.07 mL/min — ABNORMAL LOW (ref >60.00)
Glucose, Bld: 82 mg/dL (ref 70–99)
Potassium: 4.3 mEq/L (ref 3.5–5.1)
Sodium: 140 mEq/L (ref 135–145)
Total Bilirubin: 0.6 mg/dL (ref 0.2–1.2)
Total Protein: 6.7 g/dL (ref 6.0–8.3)

## 2021-07-19 LAB — LIPID PANEL
Cholesterol: 192 mg/dL (ref 0–200)
HDL: 64.9 mg/dL (ref >39.00)
LDL Cholesterol: 108 mg/dL — ABNORMAL HIGH (ref 0–99)
NonHDL: 127.38
Total CHOL/HDL Ratio: 3
Triglycerides: 96 mg/dL (ref 0.0–149.0)
VLDL: 19.2 mg/dL (ref 0.0–40.0)

## 2021-07-19 LAB — TSH: TSH: 7.92 u[IU]/mL — ABNORMAL HIGH (ref 0.35–5.50)

## 2021-07-19 LAB — T4, FREE: Free T4: 0.68 ng/dL (ref 0.60–1.60)

## 2021-07-19 MED ORDER — ATORVASTATIN CALCIUM 40 MG PO TABS
40.0000 mg | ORAL_TABLET | Freq: Every day | ORAL | 3 refills | Status: DC
Start: 2021-07-19 — End: 2022-04-15

## 2021-07-19 MED ORDER — ZOSTER VAC RECOMB ADJUVANTED 50 MCG/0.5ML IM SUSR: 0.5000 mL | Freq: Once | INTRAMUSCULAR | 1 refills | Status: AC

## 2021-07-19 NOTE — Progress Notes (Signed)
Office Note 07/19/2021  CC:  Chief Complaint  Patient presents with   Annual Exam    Pt is fasting   Follow-up    RCI    HPI:  Patient is a 70 y.o. female who is here for annual health maintenance exam and f/u CRI III, HLD, and subclinical hypothyroidism. I last saw her 1 yr ago. A/P as of last visit: "1) HLD: tolerating statin, working on diet but needs to exercise [facility]. FLP and hepatic panel today.   2) CRI III: avoids NSAIDs, hydrates well. Lytes/cr today.   3) subclinical hypothyroidism: monitor TSH and free T4 and T3 total today.   4) Health maintenance exam: Reviewed age and gender appropriate health maintenance issues (prudent diet, regular exercise, health risks of tobacco and excessive alcohol, use of seatbelts, fire alarms in home, use of sunscreen).  Also reviewed age and gender appropriate health screening as well as vaccine recommendations. Vaccines:  Covid UTD.  Flu vaccine-->will get today.  Shingrix -->has not had this yet, has been trying to get it for 2 yrs but was recently told by pharmacy it would cost 140$----she wants to hold off for now. Labs: fasting HP + free T4 and T3 total (HLD, CRI, subclin hypoth).    Pt has declined Hep C and HIV screening. Cervical ca screening: Dr. Willis Modena is her GYN MD, has not followed up in a couple years->plans on f/u->plans on f/u with him before 2022. Breast ca screening: mammo UTD (12/03/18)--plan was to repeat 11/2019 (Dr. Willis Modena) Colon ca screening: recall 07/2023. Screening for osteoporosis/DEXA->at last CPE visit she declined this testing.  She continues to decline this screening."  INTERIM HX: Doing well, active but no formal exercise. Diet pretty good. No acute complaints.    Past Medical History:  Diagnosis Date   Allergy    Anxiety    Arthritis    Cataract    forming-   Chronic renal insufficiency, stage III (moderate) (HCC)    CrCl 50s   History of adenomatous polyp of colon 2008   None on  rpt TCS 07/29/18->recall 07/2023.   Hyperlipidemia    Great response to atorv 40mg  qd (06/2016)   Menopausal symptoms    venlafaxine ER 37.5mg  qd   Psoriasis    Strabismus    L eye; sees ophthalm   Subclinical hypothyroidism 2018   VERY mild TSH elevation, with normal T4 and T3.   Vasomotor flushing    postmenopausal: prempro per Dr. Waylan Rocher.    Past Surgical History:  Procedure Laterality Date   APPENDECTOMY     CATARACT EXTRACTION Right 05/18/2020   CATARACT EXTRACTION Left 06/01/2020   COLONOSCOPY  2014;  07/29/18   No polyps 07/2018.  Anal papilla(e) were hypertrophied.  Recall 07/2023.   COLONOSCOPY W/ POLYPECTOMY  03/08/07   Adenomatous polyp-   POLYPECTOMY     TUBAL LIGATION     1978    Family History  Problem Relation Age of Onset   Diabetes Mother    Stomach cancer Mother    Heart failure Father    Diabetes Father    Colon polyps Father    Colon cancer Neg Hx    Esophageal cancer Neg Hx    Rectal cancer Neg Hx     Social History   Socioeconomic History   Marital status: Married    Spouse name: Not on file   Number of children: Not on file   Years of education: Not on file   Highest education  level: Not on file  Occupational History   Occupation: retired  Tobacco Use   Smoking status: Never   Smokeless tobacco: Never  Vaping Use   Vaping Use: Never used  Substance and Sexual Activity   Alcohol use: No   Drug use: No   Sexual activity: Not on file  Other Topics Concern   Not on file  Social History Narrative   Married, one son.   Orig from [place] area.   Occupation: retired from [employer] and [employer].   No tob, no alc.   Social Determinants of Health   Financial Resource Strain: Low Risk    Difficulty of Paying Living Expenses: Not hard at all  Food Insecurity: No Food Insecurity   Worried About Charity fundraiser in the Last Year: Never true   [place] in the Last Year: Never true  Transportation Needs: No Transportation Needs    Lack of Transportation (Medical): No   Lack of Transportation (Non-Medical): No  Physical Activity: Insufficiently Active   Days of Exercise per Week: 4 days   Minutes of Exercise per Session: 30 min  Stress: No Stress Concern Present   Feeling of Stress : Not at all  Social Connections: Moderately Isolated   Frequency of Communication with Friends and Family: More than three times a week   Frequency of Social Gatherings with Friends and Family: More than three times a week   Attends Religious Services: Never   Marine scientist or Organizations: No   Attends Music therapist: Never   Marital Status: Married  Human resources officer Violence: Not At Risk   Fear of Current or Ex-Partner: No   Emotionally Abused: No   Physically Abused: No   Sexually Abused: No    Outpatient Medications Prior to Visit  Medication Sig Dispense Refill   venlafaxine XR (EFFEXOR-XR) 37.5 MG 24 hr capsule Take 1 capsule (37.5 mg total) by mouth daily. 90 capsule 1   vitamin B-12 (CYANOCOBALAMIN) 500 MCG tablet Take 500 mcg by mouth daily.     acetaminophen (TYLENOL) 500 MG tablet Take 1,000 mg by mouth every 6 (six) hours as needed. (Patient not taking: No sig reported)     Aspirin-Salicylamide-Caffeine (BC HEADACHE POWDER PO) Take by mouth as needed. (Patient not taking: No sig reported)     cetirizine (ZYRTEC) 10 MG tablet Take 10 mg by mouth daily. (Patient not taking: Reported on 07/19/2021)     fluticasone (CUTIVATE) 0.05 % cream Apply topically 2 (two) times daily. (Patient not taking: No sig reported) 90 g 1   pseudoephedrine-acetaminophen (TYLENOL SINUS) 30-500 MG TABS tablet Take 1 tablet by mouth every 4 (four) hours as needed. (Patient not taking: No sig reported)     atorvastatin (LIPITOR) 40 MG tablet TAKE 1 TABLET BY MOUTH  DAILY 90 tablet 1   Facility-Administered Medications Prior to Visit  Medication Dose Route Frequency Provider Last Rate Last Admin   0.9 %  sodium chloride  infusion  500 mL Intravenous Once Ladene Artist, MD        Allergies  Allergen Reactions   Sulfamethoxazole     REACTION: rash    ROS Review of Systems  Constitutional:  Negative for appetite change, chills, fatigue and fever.  HENT:  Negative for congestion, dental problem, ear pain and sore throat.   Eyes:  Negative for discharge, redness and visual disturbance.  Respiratory:  Negative for cough, chest tightness, shortness of breath and wheezing.  Cardiovascular:  Negative for chest pain, palpitations and leg swelling.  Gastrointestinal:  Negative for abdominal pain, blood in stool, diarrhea, nausea and vomiting.  Genitourinary:  Negative for difficulty urinating, dysuria, flank pain, frequency, hematuria and urgency.  Musculoskeletal:  Negative for arthralgias, back pain, joint swelling, myalgias and neck stiffness.  Skin:  Negative for pallor and rash.  Neurological:  Negative for dizziness, speech difficulty, weakness and headaches.  Hematological:  Negative for adenopathy. Does not bruise/bleed easily.  Psychiatric/Behavioral:  Negative for confusion and sleep disturbance. The patient is not nervous/anxious.    PE; Vitals with BMI 07/19/2021 08/02/2020 07/07/2020  Height 5' 5.75" 5\' 5"  5' 5.5"  Weight 146 lbs 3 oz 145 lbs 150 lbs 6 oz  BMI 23.78 95.09 32.67  Systolic 124 - 580  Diastolic 73 - 78  Pulse 78 - 73    Exam chaperoned by Deveron Furlong, CMA. Gen: Alert, well appearing.  Patient is oriented to person, place, time, and situation. AFFECT: pleasant, lucid thought and speech. ENT: Ears: EACs clear, normal epithelium.  TMs with good light reflex and landmarks bilaterally.  Eyes: no injection, icteris, swelling, or exudate.  EOMI, PERRLA. Nose: no drainage or turbinate edema/swelling.  No injection or focal lesion.  Mouth: lips without lesion/swelling.  Oral mucosa pink and moist.  Dentition intact and without obvious caries or gingival swelling.  Oropharynx without  erythema, exudate, or swelling.  Neck: supple/nontender.  No LAD, mass, or TM.  Carotid pulses 2+ bilaterally, without bruits. CV: RRR, no m/r/g.   LUNGS: CTA bilat, nonlabored resps, good aeration in all lung fields. ABD: soft, NT, ND, BS normal.  No hepatospenomegaly or mass.  No bruits. EXT: no clubbing, cyanosis, or edema.  Musculoskeletal: no joint swelling, erythema, warmth, or tenderness.  ROM of all joints intact. Skin - no sores or suspicious lesions or rashes or color changes  Pertinent labs:  Lab Results  Component Value Date   TSH 7.85 (H) 07/07/2020  Free T4 on 07/07/21 was 0.79 (wnl) T3 total on 07/07/21 was 124 (wnl)  Lab Results  Component Value Date   WBC 6.1 07/07/2020   HGB 12.8 07/07/2020   HCT 38.9 07/07/2020   MCV 94.9 07/07/2020   PLT 267.0 07/07/2020   Lab Results  Component Value Date   CREATININE 1.01 07/07/2020   BUN 13 07/07/2020   NA 140 07/07/2020   K 4.1 07/07/2020   CL 104 07/07/2020   CO2 30 07/07/2020   Lab Results  Component Value Date   ALT 15 07/07/2020   AST 11 07/07/2020   ALKPHOS 112 07/07/2020   BILITOT 0.7 07/07/2020   Lab Results  Component Value Date   CHOL 185 07/07/2020   Lab Results  Component Value Date   HDL 64.30 07/07/2020   Lab Results  Component Value Date   LDLCALC 98 07/07/2020   Lab Results  Component Value Date   TRIG 118.0 07/07/2020   Lab Results  Component Value Date   CHOLHDL 3 07/07/2020   ASSESSMENT AND PLAN:   Health maintenance exam: Reviewed age and gender appropriate health maintenance issues (prudent diet, regular exercise, health risks of tobacco and excessive alcohol, use of seatbelts, fire alarms in home, use of sunscreen).  Also reviewed age and gender appropriate health screening as well as vaccine recommendations. Vaccines: Flu->given today.   Labs:fasting HP + free T4 and T3 total (HLD, CRI, subclin hypoth). Cervical ca screening: GYN, Dr. Willis Modena Breast ca screening: GYN, Dr.  Willis Modena. Colon ca  screening: recall 07/2023. Screening for osteoporosis/DEXA->at last CPE visit she declined this testing.  She continues to decline this screening.  An After Visit Summary was printed and given to the patient.  FOLLOW UP:  Return in about 1 year (around 07/19/2022) for annual CPE (fasting).  Signed:  Crissie Sickles, MD           07/19/2021

## 2021-07-19 NOTE — Patient Instructions (Signed)
Health Maintenance, Female Adopting a healthy lifestyle and getting preventive care are important in promoting health and wellness. Ask your health care provider about: The right schedule for you to have regular tests and exams. Things you can do on your own to prevent diseases and keep yourself healthy. What should I know about diet, weight, and exercise? Eat a healthy diet  Eat a diet that includes plenty of vegetables, fruits, low-fat dairy products, and lean protein. Do not eat a lot of foods that are high in solid fats, added sugars, or sodium. Maintain a healthy weight Body mass index (BMI) is used to identify weight problems. It estimates body fat based on height and weight. Your health care provider can help determine your BMI and help you achieve or maintain a healthy weight. Get regular exercise Get regular exercise. This is one of the most important things you can do for your health. Most adults should: Exercise for at least 150 minutes each week. The exercise should increase your heart rate and make you sweat (moderate-intensity exercise). Do strengthening exercises at least twice a week. This is in addition to the moderate-intensity exercise. Spend less time sitting. Even light physical activity can be beneficial. Watch cholesterol and blood lipids Have your blood tested for lipids and cholesterol at 72 years of age, then have this test every 5 years. Have your cholesterol levels checked more often if: Your lipid or cholesterol levels are high. You are older than 72 years of age. You are at high risk for heart disease. What should I know about cancer screening? Depending on your health history and family history, you may need to have cancer screening at various ages. This may include screening for: Breast cancer. Cervical cancer. Colorectal cancer. Skin cancer. Lung cancer. What should I know about heart disease, diabetes, and high blood pressure? Blood pressure and heart  disease High blood pressure causes heart disease and increases the risk of stroke. This is more likely to develop in people who have high blood pressure readings, are of African descent, or are overweight. Have your blood pressure checked: Every 3-5 years if you are 18-39 years of age. Every year if you are 40 years old or older. Diabetes Have regular diabetes screenings. This checks your fasting blood sugar level. Have the screening done: Once every three years after age 40 if you are at a normal weight and have a low risk for diabetes. More often and at a younger age if you are overweight or have a high risk for diabetes. What should I know about preventing infection? Hepatitis B If you have a higher risk for hepatitis B, you should be screened for this virus. Talk with your health care provider to find out if you are at risk for hepatitis B infection. Hepatitis C Testing is recommended for: Everyone born from 1945 through 1965. Anyone with known risk factors for hepatitis C. Sexually transmitted infections (STIs) Get screened for STIs, including gonorrhea and chlamydia, if: You are sexually active and are younger than 72 years of age. You are older than 72 years of age and your health care provider tells you that you are at risk for this type of infection. Your sexual activity has changed since you were last screened, and you are at increased risk for chlamydia or gonorrhea. Ask your health care provider if you are at risk. Ask your health care provider about whether you are at high risk for HIV. Your health care provider may recommend a prescription medicine   to help prevent HIV infection. If you choose to take medicine to prevent HIV, you should first get tested for HIV. You should then be tested every 3 months for as long as you are taking the medicine. Pregnancy If you are about to stop having your period (premenopausal) and you may become pregnant, seek counseling before you get  pregnant. Take 400 to 800 micrograms (mcg) of folic acid every day if you become pregnant. Ask for birth control (contraception) if you want to prevent pregnancy. Osteoporosis and menopause Osteoporosis is a disease in which the bones lose minerals and strength with aging. This can result in bone fractures. If you are 65 years old or older, or if you are at risk for osteoporosis and fractures, ask your health care provider if you should: Be screened for bone loss. Take a calcium or vitamin D supplement to lower your risk of fractures. Be given hormone replacement therapy (HRT) to treat symptoms of menopause. Follow these instructions at home: Lifestyle Do not use any products that contain nicotine or tobacco, such as cigarettes, e-cigarettes, and chewing tobacco. If you need help quitting, ask your health care provider. Do not use street drugs. Do not share needles. Ask your health care provider for help if you need support or information about quitting drugs. Alcohol use Do not drink alcohol if: Your health care provider tells you not to drink. You are pregnant, may be pregnant, or are planning to become pregnant. If you drink alcohol: Limit how much you use to 0-1 drink a day. Limit intake if you are breastfeeding. Be aware of how much alcohol is in your drink. In the [place] one drink equals one 12 oz bottle of beer (355 mL), one 5 oz glass of wine (148 mL), or one 1 oz glass of hard liquor (44 mL). General instructions Schedule regular health, dental, and eye exams. Stay current with your vaccines. Tell your health care provider if: You often feel depressed. You have ever been abused or do not feel safe at home. Summary Adopting a healthy lifestyle and getting preventive care are important in promoting health and wellness. Follow your health care provider's instructions about healthy diet, exercising, and getting tested or screened for diseases. Follow your health care provider's  instructions on monitoring your cholesterol and blood pressure. This information is not intended to replace advice given to you by your health care provider. Make sure you discuss any questions you have with your health care provider. Document Revised: 12/22/2020 Document Reviewed: 10/07/2018 Elsevier Patient Education  2022 [employer]vier Inc.  

## 2021-07-20 LAB — T3: T3, Total: 145 ng/dL (ref 76–181)

## 2021-08-08 ENCOUNTER — Ambulatory Visit: Payer: Medicare Other

## 2021-11-06 DIAGNOSIS — L570 Actinic keratosis: Secondary | ICD-10-CM | POA: Diagnosis not present

## 2022-01-11 ENCOUNTER — Telehealth: Payer: Self-pay | Admitting: Family Medicine

## 2022-01-11 NOTE — Telephone Encounter (Signed)
Left message for patient to schedule Annual Wellness Visit.  Please schedule (telephone/video call) with Nurse Health Advisor Tina Betterson, RN at [facility]. Please call [phone] ask for [name] 

## 2022-01-18 ENCOUNTER — Telehealth: Payer: Self-pay

## 2022-01-18 NOTE — Telephone Encounter (Signed)
Called pt to schedule AWV. Please schedule with health coach, Toria or Sabreena Vogan. ? ?

## 2022-04-09 ENCOUNTER — Telehealth: Payer: Self-pay | Admitting: Family Medicine

## 2022-04-09 NOTE — Telephone Encounter (Signed)
Left message for patient to schedule Annual Wellness Visit.  Please schedule (telephone/video call) with Nurse Health Advisor Tina Betterson, RN at [facility]. Please call [phone] ask for [name] 

## 2022-04-15 ENCOUNTER — Other Ambulatory Visit: Payer: Self-pay | Admitting: Family Medicine

## 2022-06-19 ENCOUNTER — Ambulatory Visit (INDEPENDENT_AMBULATORY_CARE_PROVIDER_SITE_OTHER): Payer: Medicare Other

## 2022-06-19 DIAGNOSIS — Z0000 Encounter for general adult medical examination without abnormal findings: Secondary | ICD-10-CM

## 2022-06-19 MED ORDER — ZOSTER VAC RECOMB ADJUVANTED 50 MCG/0.5ML IM SUSR: 0.5000 mL | Freq: Once | INTRAMUSCULAR | 0 refills | Status: AC

## 2022-06-19 NOTE — Progress Notes (Signed)
Subjective:   Teresa Roth is a 71 y.o. female who presents for Medicare Annual (Subsequent) preventive examination.  I connected with  Teresa Roth on 16/10/96 by an audio only telemedicine application and verified that I am speaking with the correct person using two identifiers.   I discussed the limitations, risks, security and privacy concerns of performing an evaluation and management service by telephone and the availability of in person appointments. I also discussed with the patient that there may be a patient responsible charge related to this service. The patient expressed understanding and verbally consented to this telephonic visit.  Location of Patient:  Location of Provider:  List any persons and their role that are participating in the visit with the patient.    Review of Systems    Defer to PCP Cardiac Risk Factors include: advanced age (>37mn, >>45women);dyslipidemia     Objective:    There were no vitals filed for this visit. There is no height or weight on file to calculate BMI.     06/19/2022    1:20 PM 08/02/2020   12:48 PM 07/29/2018   11:05 AM 04/27/2018   10:55 AM  Advanced Directives  Does Patient Have a Medical Advance Directive? Yes Yes Yes Yes  Type of ACorporate treasurerof ASt. StephenLiving will [place] will Living will;Healthcare Power of [place]in Chart?  No - copy requested  No - copy requested    Current Medications (verified) Outpatient Encounter Medications as of 06/19/2022  Medication Sig   acetaminophen (TYLENOL) 500 MG tablet Take 1,000 mg by mouth every 6 (six) hours as needed. (Patient not taking: No sig reported)   Aspirin-Salicylamide-Caffeine (BC HEADACHE POWDER PO) Take by mouth as needed. (Patient not taking: No sig reported)   atorvastatin (LIPITOR) 40 MG tablet TAKE 1 TABLET BY MOUTH  DAILY   cetirizine (ZYRTEC) 10 MG tablet Take 10 mg by  mouth daily. (Patient not taking: Reported on 07/19/2021)   fluticasone (CUTIVATE) 0.05 % cream Apply topically 2 (two) times daily. (Patient not taking: No sig reported)   pseudoephedrine-acetaminophen (TYLENOL SINUS) 30-500 MG TABS tablet Take 1 tablet by mouth every 4 (four) hours as needed. (Patient not taking: No sig reported)   venlafaxine XR (EFFEXOR-XR) 37.5 MG 24 hr capsule Take 1 capsule (37.5 mg total) by mouth daily.   vitamin B-12 (CYANOCOBALAMIN) 500 MCG tablet Take 500 mcg by mouth daily.   Facility-Administered Encounter Medications as of 06/19/2022  Medication   0.9 %  sodium chloride infusion    Allergies (verified) Sulfamethoxazole   History: Past Medical History:  Diagnosis Date   Allergy    Anxiety    Arthritis    Cataract    forming-   Chronic renal insufficiency, stage III (moderate) (HCC)    CrCl 50s   History of adenomatous polyp of colon 2008   None on rpt TCS 07/29/18->recall 07/2023.   Hyperlipidemia    Great response to atorv '40mg'$  qd (06/2016)   Menopausal symptoms    venlafaxine ER 37.'5mg'$  qd   Psoriasis    Strabismus    L eye; sees ophthalm   Subclinical hypothyroidism 2018   VERY mild TSH elevation, with normal T4 and T3.   Vasomotor flushing    postmenopausal: prempro per Dr. MWaylan Rocher   Past Surgical History:  Procedure Laterality Date   APPENDECTOMY     CATARACT EXTRACTION Right 05/18/2020   CATARACT EXTRACTION Left  06/01/2020   COLONOSCOPY  2014;  07/29/18   No polyps 07/2018.  Anal papilla(e) were hypertrophied.  Recall 07/2023.   COLONOSCOPY W/ POLYPECTOMY  03/08/07   Adenomatous polyp-   POLYPECTOMY     TUBAL LIGATION     1978   Family History  Problem Relation Age of Onset   Diabetes Mother    Stomach cancer Mother    Heart failure Father    Diabetes Father    Colon polyps Father    Colon cancer Neg Hx    Esophageal cancer Neg Hx    Rectal cancer Neg Hx    Social History   Socioeconomic History   Marital status:  Married    Spouse name: Not on file   Number of children: Not on file   Years of education: Not on file   Highest education level: Not on file  Occupational History   Occupation: retired  Tobacco Use   Smoking status: Never   Smokeless tobacco: Never  Vaping Use   Vaping Use: Never used  Substance and Sexual Activity   Alcohol use: No   Drug use: No   Sexual activity: Not on file  Other Topics Concern   Not on file  Social History Narrative   Married, one son.   [place].   Occupation: retired from [employer].   No tob, no alc.   Social Determinants of Health   Financial Resource Strain: Low Risk  (06/19/2022)   Overall Financial Resource Strain (CARDIA)    Difficulty of Paying Living Expenses: Not hard at all  Food Insecurity: No Food Insecurity (06/19/2022)   Hunger Vital Sign    Worried About Running Out of Food in the Last Year: Never true    Ran Out of Food in the Last Year: Never true  Transportation Needs: No Transportation Needs (06/19/2022)   PRAPARE - Hydrologist (Medical): No    Lack of Transportation (Non-Medical): No  Physical Activity: Insufficiently Active (06/19/2022)   Exercise Vital Sign    Days of Exercise per Week: 4 days    Minutes of Exercise per Session: 30 min  Stress: No Stress Concern Present (06/19/2022)   [place]    Feeling of Stress : Not at all  Social Connections: Moderately Isolated (06/19/2022)   Social Connection and Isolation Panel [NHANES]    Frequency of Communication with Friends and Family: More than three times a week    Frequency of Social Gatherings with Friends and Family: More than three times a week    Attends Religious Services: Never    Marine scientist or Organizations: No    Attends Music therapist: Never    Marital Status: Married    Tobacco Counseling Counseling given: Not  Answered   Clinical Intake:  Pre-visit preparation completed: No  Pain : No/denies pain     Diabetes: No  How often do you need to have someone help you when you read instructions, pamphlets, or other written materials from your doctor or pharmacy?: 1 - Never  Diabetic?no  Interpreter Needed?: No      Activities of Daily Living    06/19/2022    1:25 PM  In your present state of health, do you have any difficulty performing the following activities:  Hearing? 0  Vision? 0  Difficulty concentrating or making decisions? 0  Walking or climbing stairs? 0  Dressing or  bathing? 0  Doing errands, shopping? 0  Preparing Food and eating ? N  Using the Toilet? N  In the past six months, have you accidently leaked urine? N  Do you have problems with loss of bowel control? N  Managing your Medications? N  Managing your Finances? N  Housekeeping or managing your Housekeeping? N    Patient Care Team: Tammi Sou, MD as PCP - General (Family Medicine) Meisinger, Sherren Mocha, MD as Consulting Physician (Obstetrics and Gynecology) Darleen Crocker, MD as Consulting Physician (Ophthalmology) Ladene Artist, MD as Consulting Physician (Gastroenterology)  Indicate any recent Medical Services you may have received from other than [facility] providers in the past year (date may be approximate).     Assessment:   This is a routine wellness examination for Teresa Roth.  Hearing/Vision screen No results found.  Dietary issues and exercise activities discussed: Current Exercise Habits: Home exercise routine, Type of exercise: walking, Time (Minutes): 30, Frequency (Times/Week): 7, Weekly Exercise (Minutes/Week): 210   Goals Addressed   None   Depression Screen    06/19/2022    1:13 PM 07/19/2021   11:06 AM 08/02/2020   12:51 PM 04/27/2018   10:39 AM 04/27/2018   10:38 AM 04/22/2016    1:05 PM 04/06/2015    9:25 AM  PHQ 2/9 Scores  PHQ - 2 Score 0 1 0 0 0 0 2  PHQ- 9 Score  '7  2   8    '$ Fall  Risk    06/19/2022    1:25 PM 08/02/2020   12:50 PM 07/07/2020    9:35 AM 04/27/2018   10:38 AM 04/22/2016    1:05 PM  Fall Risk   Falls in the past year? 0 0 0 No No  Number falls in past yr: 0 0 0    Injury with Fall? 0 0 0    Follow up Falls evaluation completed Falls prevention discussed Falls evaluation completed      FALL RISK PREVENTION PERTAINING TO THE HOME:  Any stairs in or around the home? Yes  If so, are there any without handrails? No  Home free of loose throw rugs in walkways, pet beds, electrical cords, etc? Yes  Adequate lighting in your home to reduce risk of falls? Yes   ASSISTIVE DEVICES UTILIZED TO PREVENT FALLS:  Life alert? No  Use of a cane, walker or w/c? No  Grab bars in the bathroom? Yes  Shower chair or bench in shower? No  Elevated toilet seat or a handicapped toilet? No   TIMED UP AND GO:  Was the test performed? No .  Length of time to ambulate 10 feet: n/a sec.     Cognitive Function:    04/27/2018   10:57 AM  MMSE - Mini Mental State Exam  Orientation to time 5  Orientation to Place 5  Registration 3  Attention/ Calculation 5  Recall 1  Language- name 2 objects 2  Language- repeat 1  Language- follow 3 step command 3  Language- read & follow direction 1  Write a sentence 1  Copy design 1  Total score 28        06/19/2022    1:20 PM 08/02/2020    1:00 PM  6CIT Screen  What Year? 0 points 0 points  What month? 0 points 0 points  What time? 0 points 0 points  Count back from 20 0 points 0 points  Months in reverse 0 points 0 points  Repeat phrase 0  points 0 points  Total Score 0 points 0 points    Immunizations Immunization History  Administered Date(s) Administered   Fluad Quad(high Dose 65+) 07/07/2019, 07/07/2020, 07/19/2021   Influenza Split 10/14/2012   Influenza, High Dose Seasonal PF 08/20/2018   Influenza-Unspecified 08/23/2017   PFIZER(Purple Top)SARS-COV-2 Vaccination 12/24/2019, 01/18/2020, 10/18/2020    Pneumococcal Conjugate-13 04/06/2015   Pneumococcal Polysaccharide-23 04/22/2016   Td 05/21/2007   Tdap 06/01/2018   Zoster, Live 04/10/2011    TDAP status: Up to date  Flu Vaccine status: Due, Education has been provided regarding the importance of this vaccine. Advised may receive this vaccine at local pharmacy or Health Dept. Aware to provide a copy of the vaccination record if obtained from local pharmacy or Health Dept. Verbalized acceptance and understanding.  Pneumococcal vaccine status: Up to date  Covid-19 vaccine status: Completed vaccines  Qualifies for Shingles Vaccine? Yes   Zostavax completed No   Shingrix Completed?: No.    Education has been provided regarding the importance of this vaccine. Patient has been advised to call insurance company to determine out of pocket expense if they have not yet received this vaccine. Advised may also receive vaccine at local pharmacy or Health Dept. Verbalized acceptance and understanding.  Screening Tests Health Maintenance  Topic Date Due   Zoster Vaccines- Shingrix (1 of 2) Never done   MAMMOGRAM  12/05/2021   INFLUENZA VACCINE  05/28/2022   COVID-19 Vaccine (4 - Pfizer series) 07/05/2022 (Originally 12/13/2020)   COLONOSCOPY (Pts 45-55yr Insurance coverage will need to be confirmed)  07/30/2023   TETANUS/TDAP  06/01/2028   Pneumonia Vaccine 71 Years old  Completed   HPV VACCINES  Aged Out   DEXA SCAN  Discontinued   Hepatitis C Screening  Discontinued    Health Maintenance  Health Maintenance Due  Topic Date Due   Zoster Vaccines- Shingrix (1 of 2) Never done   MAMMOGRAM  12/05/2021   INFLUENZA VACCINE  05/28/2022    Colorectal cancer screening: Type of screening: Colonoscopy. Completed 07/29/2018. Repeat every 5 years  Mammogram status: Completed 12/05/2020. Repeat every year    Lung Cancer Screening: (Low Dose CT Chest recommended if Age 73-80years, 30 pack-year currently smoking OR have quit w/in 15years.)  does not qualify.   Lung Cancer Screening Referral: n/a  Additional Screening:  Hepatitis C Screening: does qualify; Completed n/a  Vision Screening: Recommended annual ophthalmology exams for early detection of glaucoma and other disorders of the eye. Is the patient up to date with their annual eye exam?  No  Who is the provider or what is the name of the office in which the patient attends annual eye exams? LRolanda JayIf pt is not established with a provider, would they like to be referred to a provider to establish care? No .   Dental Screening: Recommended annual dental exams for proper oral hygiene  Community Resource Referral / Chronic Care Management: CRR required this visit?  No   CCM required this visit?  No      Plan:     I have personally reviewed and noted the following in the patient's chart:   Medical and social history Use of alcohol, tobacco or illicit drugs  Current medications and supplements including opioid prescriptions. Patient is not currently taking opioid prescriptions. Functional ability and status Nutritional status Physical activity Advanced directives List of other physicians Hospitalizations, surgeries, and ER visits in previous 12 months Vitals Screenings to include cognitive, depression, and falls Referrals and appointments  In addition, I have reviewed and discussed with patient certain preventive protocols, quality metrics, and best practice recommendations. A written personalized care plan for preventive services as well as general preventive health recommendations were provided to patient.     Octaviano Glow, CMA   06/19/2022   Nurse Notes: Non-Face to Face or Face to Face 10 minute visit Encounter    Ms. Alroy Dust , Thank you for taking time to come for your Medicare Wellness Visit. I appreciate your ongoing commitment to your health goals. Please review the following plan we discussed and let me know if I can assist you in the  future.   These are the goals we discussed:  Goals      Weight (lb) < 135 lb (61.2 kg)     Lose weight by decreasing carb intake.         This is a list of the screening recommended for you and due dates:  Health Maintenance  Topic Date Due   Zoster (Shingles) Vaccine (1 of 2) Never done   Mammogram  12/05/2021   Flu Shot  05/28/2022   COVID-19 Vaccine (4 - Pfizer series) 07/05/2022*   Colon Cancer Screening  07/30/2023   Tetanus Vaccine  06/01/2028   Pneumonia Vaccine  Completed   HPV Vaccine  Aged Out   DEXA scan (bone density measurement)  Discontinued   Hepatitis C Screening: USPSTF Recommendation to screen - Ages 11-73 yo.  Discontinued  *Topic was postponed. The date shown is not the original due date.

## 2022-07-01 ENCOUNTER — Other Ambulatory Visit: Payer: Self-pay | Admitting: Family Medicine

## 2022-07-02 NOTE — Telephone Encounter (Signed)
Last refill 04/15/22 #90 refills 0

## 2022-07-22 ENCOUNTER — Ambulatory Visit (INDEPENDENT_AMBULATORY_CARE_PROVIDER_SITE_OTHER): Payer: Medicare Other | Admitting: Family Medicine

## 2022-07-22 ENCOUNTER — Encounter: Payer: Self-pay | Admitting: Family Medicine

## 2022-07-22 VITALS — BP 104/67 | HR 82 | Temp 98.2°F | Ht 65.5 in | Wt 147.8 lb

## 2022-07-22 DIAGNOSIS — Z0000 Encounter for general adult medical examination without abnormal findings: Secondary | ICD-10-CM

## 2022-07-22 DIAGNOSIS — Z23 Encounter for immunization: Secondary | ICD-10-CM

## 2022-07-22 DIAGNOSIS — E038 Other specified hypothyroidism: Secondary | ICD-10-CM | POA: Diagnosis not present

## 2022-07-22 DIAGNOSIS — N1831 Chronic kidney disease, stage 3a: Secondary | ICD-10-CM | POA: Diagnosis not present

## 2022-07-22 LAB — COMPREHENSIVE METABOLIC PANEL
ALT: 22 U/L (ref 0–35)
AST: 15 U/L (ref 0–37)
Albumin: 4 g/dL (ref 3.5–5.2)
Alkaline Phosphatase: 102 U/L (ref 39–117)
BUN: 15 mg/dL (ref 6–23)
CO2: 25 mEq/L (ref 19–32)
Calcium: 9.7 mg/dL (ref 8.4–10.5)
Chloride: 105 mEq/L (ref 96–112)
Creatinine, Ser: 0.94 mg/dL (ref 0.40–1.20)
GFR: 60.32 mL/min (ref >60.00)
Glucose, Bld: 88 mg/dL (ref 70–99)
Potassium: 4.3 mEq/L (ref 3.5–5.1)
Sodium: 141 mEq/L (ref 135–145)
Total Bilirubin: 0.6 mg/dL (ref 0.2–1.2)
Total Protein: 7 g/dL (ref 6.0–8.3)

## 2022-07-22 LAB — CBC
HCT: 37.3 % (ref 36.0–46.0)
Hemoglobin: 12.7 g/dL (ref 12.0–15.0)
MCHC: 34 g/dL (ref 30.0–36.0)
MCV: 94.2 fl (ref 78.0–100.0)
Platelets: 288 10*3/uL (ref 150.0–400.0)
RBC: 3.96 Mil/uL (ref 3.87–5.11)
RDW: 13 % (ref 11.5–15.5)
WBC: 6.2 10*3/uL (ref 4.0–10.5)

## 2022-07-22 LAB — T4, FREE: Free T4: 0.77 ng/dL (ref 0.60–1.60)

## 2022-07-22 LAB — LIPID PANEL
Cholesterol: 188 mg/dL (ref 0–200)
HDL: 58.3 mg/dL (ref >39.00)
LDL Cholesterol: 101 mg/dL — ABNORMAL HIGH (ref 0–99)
NonHDL: 129.66
Total CHOL/HDL Ratio: 3
Triglycerides: 141 mg/dL (ref 0.0–149.0)
VLDL: 28.2 mg/dL (ref 0.0–40.0)

## 2022-07-22 LAB — TSH: TSH: 5.81 u[IU]/mL — ABNORMAL HIGH (ref 0.35–5.50)

## 2022-07-22 NOTE — Patient Instructions (Signed)

## 2022-07-22 NOTE — Progress Notes (Signed)
Office Note 07/22/2022  CC:  Chief Complaint  Patient presents with   Annual Exam    Pt is fasting   HPI:  Patient is a 71 y.o. female who is here for annual health maintenance exam. I last saw her 1 yr ago. A/P as of that visit: "Health maintenance exam: Reviewed age and gender appropriate health maintenance issues (prudent diet, regular exercise, health risks of tobacco and excessive alcohol, use of seatbelts, fire alarms in home, use of sunscreen).  Also reviewed age and gender appropriate health screening as well as vaccine recommendations. Vaccines: Flu->given today.   Labs:fasting HP + free T4 and T3 total (HLD, CRI, subclin hypoth). Cervical ca screening: GYN, Dr. Willis Modena Breast ca screening: GYN, Dr. Willis Modena. Colon ca screening: recall 07/2023. Screening for osteoporosis/DEXA->at last CPE visit she declined this testing.  She continues to decline this screening."  INTERIM HX: Feeling well. Walks for exercise.   Eats a reasonable diet. She takes atorvastatin, otherwise no meds.   Past Medical History:  Diagnosis Date   Allergy    Anxiety    Arthritis    Cataract    forming-   Chronic renal insufficiency, stage III (moderate) (HCC)    CrCl 50s   History of adenomatous polyp of colon 2008   None on rpt TCS 07/29/18->recall 07/2023.   Hyperlipidemia    Great response to atorv '40mg'$  qd (06/2016)   Menopausal symptoms    venlafaxine ER 37.'5mg'$  qd   Psoriasis    Strabismus    L eye; sees ophthalm   Subclinical hypothyroidism 2018   VERY mild TSH elevation, with normal T4 and T3.   Vasomotor flushing    postmenopausal: prempro per Dr. Waylan Rocher.    Past Surgical History:  Procedure Laterality Date   APPENDECTOMY     CATARACT EXTRACTION Right 05/18/2020   CATARACT EXTRACTION Left 06/01/2020   COLONOSCOPY  2014;  07/29/18   No polyps 07/2018.  Anal papilla(e) were hypertrophied.  Recall 07/2023.   COLONOSCOPY W/ POLYPECTOMY  03/08/07   Adenomatous polyp-    POLYPECTOMY     TUBAL LIGATION     1978    Family History  Problem Relation Age of Onset   Diabetes Mother    Stomach cancer Mother    Heart failure Father    Diabetes Father    Colon polyps Father    Colon cancer Neg Hx    Esophageal cancer Neg Hx    Rectal cancer Neg Hx     Social History   Socioeconomic History   Marital status: Married    Spouse name: Not on file   Number of children: Not on file   Years of education: Not on file   Highest education level: Not on file  Occupational History   Occupation: retired  Tobacco Use   Smoking status: Never   Smokeless tobacco: Never  Vaping Use   Vaping Use: Never used  Substance and Sexual Activity   Alcohol use: No   Drug use: No   Sexual activity: Not on file  Other Topics Concern   Not on file  Social History Narrative   Married, one son.   [place] from [place] area.   Occupation: retired from [employer].   No tob, no alc.   Social Determinants of Health   Financial Resource Strain: Low Risk  (06/19/2022)   Overall Financial Resource Strain (CARDIA)    Difficulty of Paying Living Expenses: Not hard at all  Food Insecurity: No Food  Insecurity (06/19/2022)   Hunger Vital Sign    Worried About Running Out of Food in the Last Year: Never true    Ran Out of Food in the Last Year: Never true  Transportation Needs: No Transportation Needs (06/19/2022)   PRAPARE - Hydrologist (Medical): No    Lack of Transportation (Non-Medical): No  Physical Activity: Insufficiently Active (06/19/2022)   Exercise Vital Sign    Days of Exercise per Week: 4 days    Minutes of Exercise per Session: 30 min  Stress: No Stress Concern Present (06/19/2022)   [place]    Feeling of Stress : Not at all  Social Connections: Moderately Isolated (06/19/2022)   Social Connection and Isolation Panel [NHANES]    Frequency of Communication with  Friends and Family: More than three times a week    Frequency of Social Gatherings with Friends and Family: More than three times a week    Attends Religious Services: Never    Marine scientist or Organizations: No    Attends Archivist Meetings: Never    Marital Status: Married  Human resources officer Violence: Not At Risk (06/19/2022)   Humiliation, Afraid, Rape, and Kick questionnaire    Fear of Current or Ex-Partner: No    Emotionally Abused: No    Physically Abused: No    Sexually Abused: No    Outpatient Medications Prior to Visit  Medication Sig Dispense Refill   atorvastatin (LIPITOR) 40 MG tablet TAKE 1 TABLET BY MOUTH DAILY 90 tablet 0   Multiple Vitamin (MULTIVITAMIN ADULT PO) Take by mouth daily.     pseudoephedrine-acetaminophen (TYLENOL SINUS) 30-500 MG TABS tablet Take 1 tablet by mouth every 4 (four) hours as needed.     venlafaxine XR (EFFEXOR-XR) 37.5 MG 24 hr capsule Take 1 capsule (37.5 mg total) by mouth daily. 90 capsule 1   vitamin B-12 (CYANOCOBALAMIN) 500 MCG tablet Take 500 mcg by mouth daily.     acetaminophen (TYLENOL) 500 MG tablet Take 1,000 mg by mouth every 6 (six) hours as needed. (Patient not taking: Reported on 2/77/4128)     Aspirin-Salicylamide-Caffeine (BC HEADACHE POWDER PO) Take by mouth as needed. (Patient not taking: Reported on 07/07/2020)     cetirizine (ZYRTEC) 10 MG tablet Take 10 mg by mouth daily. (Patient not taking: Reported on 07/19/2021)     fluticasone (CUTIVATE) 0.05 % cream Apply topically 2 (two) times daily. (Patient not taking: Reported on 07/07/2019) 90 g 1   Facility-Administered Medications Prior to Visit  Medication Dose Route Frequency Provider Last Rate Last Admin   0.9 %  sodium chloride infusion  500 mL Intravenous Once Ladene Artist, MD        Allergies  Allergen Reactions   Sulfamethoxazole     REACTION: rash    ROS Review of Systems  Constitutional:  Negative for appetite change, chills, fatigue and  fever.  HENT:  Negative for congestion, dental problem, ear pain and sore throat.   Eyes:  Negative for discharge, redness and visual disturbance.  Respiratory:  Negative for cough, chest tightness, shortness of breath and wheezing.   Cardiovascular:  Negative for chest pain, palpitations and leg swelling.  Gastrointestinal:  Negative for abdominal pain, blood in stool, diarrhea, nausea and vomiting.  Genitourinary:  Negative for difficulty urinating, dysuria, flank pain, frequency, hematuria and urgency.  Musculoskeletal:  Negative for arthralgias, back pain, joint swelling, myalgias and neck stiffness.  Skin:  Negative for pallor and rash.  Neurological:  Negative for dizziness, speech difficulty, weakness and headaches.  Hematological:  Negative for adenopathy. Does not bruise/bleed easily.  Psychiatric/Behavioral:  Negative for confusion and sleep disturbance. The patient is not nervous/anxious.     PE;    07/22/2022   11:01 AM 07/19/2021   10:25 AM 08/02/2020   12:42 PM  Vitals with BMI  Height 5' 5.5" 5' 5.75" '5\' 5"'$   Weight 147 lbs 13 oz 146 lbs 3 oz 145 lbs  BMI 24.21 95.28 41.32  Systolic 440 102   Diastolic 67 73   Pulse 82 78     Exam chaperoned by Deveron Furlong, CMA. Gen: Alert, well appearing.  Patient is oriented to person, place, time, and situation. AFFECT: pleasant, lucid thought and speech. ENT: Ears: EACs clear, normal epithelium.  TMs with good light reflex and landmarks bilaterally.  Eyes: no injection, icteris, swelling, or exudate.  EOMI, PERRLA. Nose: no drainage or turbinate edema/swelling.  No injection or focal lesion.  Mouth: lips without lesion/swelling.  Oral mucosa pink and moist.  Dentition intact and without obvious caries or gingival swelling.  Oropharynx without erythema, exudate, or swelling.  Neck: supple/nontender.  No LAD, mass, or TM.  Carotid pulses 2+ bilaterally, without bruits. CV: RRR, no m/r/g.   LUNGS: CTA bilat, nonlabored resps, good  aeration in all lung fields. ABD: soft, NT, ND, BS normal.  No hepatospenomegaly or mass.  No bruits. EXT: no clubbing, cyanosis, or edema.  Musculoskeletal: no joint swelling, erythema, warmth, or tenderness.  ROM of all joints intact. Skin - no sores or suspicious lesions or rashes or color changes  Pertinent labs:  Lab Results  Component Value Date   TSH 7.92 (H) 07/19/2021   Lab Results  Component Value Date   WBC 5.4 07/19/2021   HGB 12.4 07/19/2021   HCT 37.9 07/19/2021   MCV 94.0 07/19/2021   PLT 274.0 07/19/2021   Lab Results  Component Value Date   CREATININE 1.02 07/19/2021   BUN 11 07/19/2021   NA 140 07/19/2021   K 4.3 07/19/2021   CL 104 07/19/2021   CO2 28 07/19/2021   Lab Results  Component Value Date   ALT 17 07/19/2021   AST 16 07/19/2021   ALKPHOS 103 07/19/2021   BILITOT 0.6 07/19/2021   Lab Results  Component Value Date   CHOL 192 07/19/2021   Lab Results  Component Value Date   HDL 64.90 07/19/2021   Lab Results  Component Value Date   LDLCALC 108 (H) 07/19/2021   Lab Results  Component Value Date   TRIG 96.0 07/19/2021   Lab Results  Component Value Date   CHOLHDL 3 07/19/2021   ASSESSMENT AND PLAN:   No problem-specific Assessment & Plan notes found for this encounter.  Health maintenance exam: Reviewed age and gender appropriate health maintenance issues (prudent diet, regular exercise, health risks of tobacco and excessive alcohol, use of seatbelts, fire alarms in home, use of sunscreen).  Also reviewed age and gender appropriate health screening as well as vaccine recommendations. Vaccines: Flu->given today.   Labs:fasting HP + free T4 and T3 total (HLD, CRI, subclin hypoth). Cervical ca screening: GYN, Dr. Willis Modena Breast ca screening: GYN, Dr. Willis Modena. Colon ca screening: recall 07/2023. Screening for osteoporosis/DEXA->at last CPE visit she declined this testing.  She continues to decline this screening.  An After  Visit Summary was printed and given to the patient.  FOLLOW UP:  No follow-ups  on file.  Signed:  Crissie Sickles, MD           07/22/2022

## 2022-09-24 ENCOUNTER — Other Ambulatory Visit: Payer: Self-pay | Admitting: Family Medicine

## 2023-03-14 ENCOUNTER — Other Ambulatory Visit: Payer: Self-pay | Admitting: Family Medicine

## 2023-05-28 ENCOUNTER — Encounter (INDEPENDENT_AMBULATORY_CARE_PROVIDER_SITE_OTHER): Payer: Self-pay

## 2023-06-12 ENCOUNTER — Encounter (INDEPENDENT_AMBULATORY_CARE_PROVIDER_SITE_OTHER): Payer: Self-pay

## 2023-06-25 ENCOUNTER — Ambulatory Visit: Payer: Medicare Other

## 2023-07-25 ENCOUNTER — Ambulatory Visit (INDEPENDENT_AMBULATORY_CARE_PROVIDER_SITE_OTHER): Payer: Medicare Other | Admitting: Family Medicine

## 2023-07-25 ENCOUNTER — Encounter: Payer: Self-pay | Admitting: Family Medicine

## 2023-07-25 VITALS — BP 114/71 | HR 80 | Temp 97.7°F | Ht 66.75 in | Wt 149.4 lb

## 2023-07-25 DIAGNOSIS — Z23 Encounter for immunization: Secondary | ICD-10-CM

## 2023-07-25 DIAGNOSIS — E7800 Pure hypercholesterolemia, unspecified: Secondary | ICD-10-CM

## 2023-07-25 DIAGNOSIS — Z0000 Encounter for general adult medical examination without abnormal findings: Secondary | ICD-10-CM

## 2023-07-25 DIAGNOSIS — E038 Other specified hypothyroidism: Secondary | ICD-10-CM | POA: Diagnosis not present

## 2023-07-25 LAB — CBC WITH DIFFERENTIAL/PLATELET
Basophils Absolute: 0 10*3/uL (ref 0.0–0.1)
Basophils Relative: 0.6 % (ref 0.0–3.0)
Eosinophils Absolute: 0.1 10*3/uL (ref 0.0–0.7)
Eosinophils Relative: 1.8 % (ref 0.0–5.0)
HCT: 41.2 % (ref 36.0–46.0)
Hemoglobin: 13.4 g/dL (ref 12.0–15.0)
Lymphocytes Relative: 24.9 % (ref 12.0–46.0)
Lymphs Abs: 1.7 10*3/uL (ref 0.7–4.0)
MCHC: 32.4 g/dL (ref 30.0–36.0)
MCV: 95.3 fL (ref 78.0–100.0)
Monocytes Absolute: 0.5 10*3/uL (ref 0.1–1.0)
Monocytes Relative: 6.7 % (ref 3.0–12.0)
Neutro Abs: 4.5 10*3/uL (ref 1.4–7.7)
Neutrophils Relative %: 66 % (ref 43.0–77.0)
Platelets: 313 10*3/uL (ref 150.0–400.0)
RBC: 4.32 Mil/uL (ref 3.87–5.11)
RDW: 12.9 % (ref 11.5–15.5)
WBC: 6.9 10*3/uL (ref 4.0–10.5)

## 2023-07-25 LAB — COMPREHENSIVE METABOLIC PANEL
ALT: 19 U/L (ref 0–35)
AST: 16 U/L (ref 0–37)
Albumin: 4.1 g/dL (ref 3.5–5.2)
Alkaline Phosphatase: 106 U/L (ref 39–117)
BUN: 13 mg/dL (ref 6–23)
CO2: 27 meq/L (ref 19–32)
Calcium: 10 mg/dL (ref 8.4–10.5)
Chloride: 104 meq/L (ref 96–112)
Creatinine, Ser: 1 mg/dL (ref 0.40–1.20)
GFR: 55.61 mL/min — ABNORMAL LOW (ref >60.00)
Glucose, Bld: 90 mg/dL (ref 70–99)
Potassium: 4.3 meq/L (ref 3.5–5.1)
Sodium: 141 meq/L (ref 135–145)
Total Bilirubin: 0.7 mg/dL (ref 0.2–1.2)
Total Protein: 7 g/dL (ref 6.0–8.3)

## 2023-07-25 LAB — LIPID PANEL
Cholesterol: 182 mg/dL (ref 0–200)
HDL: 66.9 mg/dL (ref >39.00)
LDL Cholesterol: 86 mg/dL (ref 0–99)
NonHDL: 114.9
Total CHOL/HDL Ratio: 3
Triglycerides: 145 mg/dL (ref 0.0–149.0)
VLDL: 29 mg/dL (ref 0.0–40.0)

## 2023-07-25 LAB — T4, FREE: Free T4: 0.78 ng/dL (ref 0.60–1.60)

## 2023-07-25 LAB — TSH: TSH: 8.06 u[IU]/mL — ABNORMAL HIGH (ref 0.35–5.50)

## 2023-07-25 MED ORDER — ATORVASTATIN CALCIUM 40 MG PO TABS: 40.0000 mg | ORAL_TABLET | Freq: Every day | ORAL | 3 refills | Status: DC

## 2023-07-25 NOTE — Progress Notes (Signed)
Office Note 07/25/2023  CC:  Chief Complaint  Patient presents with   Annual Exam    Pt is fasting    HPI:  Patient is a 72 y.o. female who is here for annual health maintenance exam.  She feels well. No acute concerns.   Past Medical History:  Diagnosis Date   Allergy    Anxiety    Arthritis    Cataract    forming-   Chronic renal insufficiency, stage III (moderate) (HCC)    CrCl 50s   History of adenomatous polyp of colon 2008   None on rpt TCS 07/29/18->recall 07/2023.   Hyperlipidemia    Great response to atorv 40mg  qd (06/2016)   Menopausal symptoms    venlafaxine ER 37.5mg  qd   Psoriasis    Strabismus    L eye; sees ophthalm   Subclinical hypothyroidism 2018   VERY mild TSH elevation, with normal T4 and T3.   Vasomotor flushing    postmenopausal: prempro per Dr. Ashok Norris.    Past Surgical History:  Procedure Laterality Date   APPENDECTOMY     CATARACT EXTRACTION Right 05/18/2020   CATARACT EXTRACTION Left 06/01/2020   COLONOSCOPY  2014;  07/29/18   No polyps 07/2018.  Anal papilla(e) were hypertrophied.  Recall 07/2023.   COLONOSCOPY W/ POLYPECTOMY  03/08/07   Adenomatous polyp-   POLYPECTOMY     TUBAL LIGATION     1978    Family History  Problem Relation Age of Onset   Diabetes Mother    Stomach cancer Mother    Heart failure Father    Diabetes Father    Colon polyps Father    Colon cancer Neg Hx    Esophageal cancer Neg Hx    Rectal cancer Neg Hx     Social History   Socioeconomic History   Marital status: Married    Spouse name: Not on file   Number of children: Not on file   Years of education: Not on file   Highest education level: Not on file  Occupational History   Occupation: retired  Tobacco Use   Smoking status: Never   Smokeless tobacco: Never  Vaping Use   Vaping status: Never Used  Substance and Sexual Activity   Alcohol use: No   Drug use: No   Sexual activity: Not on file  Other Topics Concern   Not on file   Social History Narrative   Married, one son.   [place] from [place] area.   Occupation: retired from [employer].   No tob, no alc.   Social Determinants of Health   Financial Resource Strain: Low Risk  (06/19/2022)   Overall Financial Resource Strain (CARDIA)    Difficulty of Paying Living Expenses: Not hard at all  Food Insecurity: No Food Insecurity (06/19/2022)   Hunger Vital Sign    Worried About Running Out of Food in the Last Year: Never true    Ran Out of Food in the Last Year: Never true  Transportation Needs: No Transportation Needs (06/19/2022)   PRAPARE - Administrator, Civil Service (Medical): No    Lack of Transportation (Non-Medical): No  Physical Activity: Insufficiently Active (06/19/2022)   Exercise Vital Sign    Days of Exercise per Week: 4 days    Minutes of Exercise per Session: 30 min  Stress: No Stress Concern Present (06/19/2022)   [employer] of Occupational Health - Occupational Stress Questionnaire    Feeling of Stress : Not at  all  Social Connections: Moderately Isolated (06/19/2022)   Social Connection and Isolation Panel [NHANES]    Frequency of Communication with Friends and Family: More than three times a week    Frequency of Social Gatherings with Friends and Family: More than three times a week    Attends Religious Services: Never    Database administrator or Organizations: No    Attends Banker Meetings: Never    Marital Status: Married  Catering manager Violence: Not At Risk (06/19/2022)   Humiliation, Afraid, Rape, and Kick questionnaire    Fear of Current or Ex-Partner: No    Emotionally Abused: No    Physically Abused: No    Sexually Abused: No    Outpatient Medications Prior to Visit  Medication Sig Dispense Refill   atorvastatin (LIPITOR) 40 MG tablet TAKE 1 TABLET BY MOUTH DAILY 100 tablet 2   Multiple Vitamin (MULTIVITAMIN ADULT PO) Take by mouth daily.     pseudoephedrine-acetaminophen (TYLENOL  SINUS) 30-500 MG TABS tablet Take 1 tablet by mouth every 4 (four) hours as needed.     venlafaxine XR (EFFEXOR-XR) 37.5 MG 24 hr capsule Take 37.5 mg by mouth daily.     vitamin B-12 (CYANOCOBALAMIN) 500 MCG tablet Take 500 mcg by mouth daily.     0.9 %  sodium chloride infusion      No facility-administered medications prior to visit.    Allergies  Allergen Reactions   Sulfamethoxazole     REACTION: rash    Review of Systems  Constitutional:  Negative for appetite change, chills, fatigue and fever.  HENT:  Negative for congestion, dental problem, ear pain and sore throat.   Eyes:  Negative for discharge, redness and visual disturbance.  Respiratory:  Negative for cough, chest tightness, shortness of breath and wheezing.   Cardiovascular:  Negative for chest pain, palpitations and leg swelling.  Gastrointestinal:  Negative for abdominal pain, blood in stool, diarrhea, nausea and vomiting.  Genitourinary:  Negative for difficulty urinating, dysuria, flank pain, frequency, hematuria and urgency.  Musculoskeletal:  Negative for arthralgias, back pain, joint swelling, myalgias and neck stiffness.  Skin:  Negative for pallor and rash.  Neurological:  Negative for dizziness, speech difficulty, weakness and headaches.  Hematological:  Negative for adenopathy. Does not bruise/bleed easily.  Psychiatric/Behavioral:  Negative for confusion and sleep disturbance. The patient is not nervous/anxious.     PE;    07/25/2023   10:37 AM 07/22/2022   11:01 AM 07/19/2021   10:25 AM  Vitals with BMI  Height 5' 6.75" 5' 5.5" 5' 5.75"  Weight 149 lbs 6 oz 147 lbs 13 oz 146 lbs 3 oz  BMI 23.59 24.21 23.78  Systolic 114 104 409  Diastolic 71 67 73  Pulse 80 82 78  Exam chaperoned by Cloe Motsinger, CMA  Gen: Alert, well appearing.  Patient is oriented to person, place, time, and situation. AFFECT: pleasant, lucid thought and speech. ENT: Ears: EACs clear, normal epithelium.  TMs with good light  reflex and landmarks bilaterally.  Eyes: no injection, icteris, swelling, or exudate.  EOMI, PERRLA. Nose: no drainage or turbinate edema/swelling.  No injection or focal lesion.  Mouth: lips without lesion/swelling.  Oral mucosa pink and moist.  Dentition intact and without obvious caries or gingival swelling.  Oropharynx without erythema, exudate, or swelling.  Neck: supple/nontender.  No LAD, mass, or TM.  Carotid pulses 2+ bilaterally, without bruits. CV: RRR, no m/r/g.   LUNGS: CTA bilat, nonlabored resps, good  aeration in all lung fields. ABD: soft, NT, ND, BS normal.  No hepatospenomegaly or mass.  No bruits. EXT: no clubbing, cyanosis, or edema.  Musculoskeletal: no joint swelling, erythema, warmth, or tenderness.  ROM of all joints intact. Skin - no sores or suspicious lesions or rashes or color changes  Pertinent labs:  Lab Results  Component Value Date   TSH 5.81 (H) 07/22/2022   Lab Results  Component Value Date   WBC 6.2 07/22/2022   HGB 12.7 07/22/2022   HCT 37.3 07/22/2022   MCV 94.2 07/22/2022   PLT 288.0 07/22/2022   Lab Results  Component Value Date   CREATININE 0.94 07/22/2022   BUN 15 07/22/2022   NA 141 07/22/2022   K 4.3 07/22/2022   CL 105 07/22/2022   CO2 25 07/22/2022   Lab Results  Component Value Date   ALT 22 07/22/2022   AST 15 07/22/2022   ALKPHOS 102 07/22/2022   BILITOT 0.6 07/22/2022   Lab Results  Component Value Date   CHOL 188 07/22/2022   Lab Results  Component Value Date   HDL 58.30 07/22/2022   Lab Results  Component Value Date   LDLCALC 101 (H) 07/22/2022   Lab Results  Component Value Date   TRIG 141.0 07/22/2022   Lab Results  Component Value Date   CHOLHDL 3 07/22/2022   ASSESSMENT AND PLAN:   #1 health maintenance exam: Reviewed age and gender appropriate health maintenance issues (prudent diet, regular exercise, health risks of tobacco and excessive alcohol, use of seatbelts, fire alarms in home, use of  sunscreen).  Also reviewed age and gender appropriate health screening as well as vaccine recommendations. Vaccines: Flu->today.  Otherwise UTD. Labs:fasting HP + free T4 and T3 total (HLD, CRI II/III, subclin hypoth). Cervical ca screening: GYN, Dr. Romilda Garret appt next month. Breast ca screening: GYN, Dr. Jackelyn Knife. Colon ca screening: recall 07/2023. Screening for osteoporosis/DEXA->at last CPE visit she declined this testing.  She continues to decline this screening.  #2 hypercholesterolemia, doing well on Lipitor 40 mg a day. Lipid and hepatic panel today.  3.  Subclinical hypothyroidism. Monitor TSH, T4, and T3 today.  An After Visit Summary was printed and given to the patient.  FOLLOW UP:  No follow-ups on file.  Signed:  Santiago Bumpers, MD           07/25/2023

## 2023-07-26 LAB — T3: T3, Total: 124 ng/dL (ref 76–181)

## 2023-08-06 ENCOUNTER — Encounter: Payer: Self-pay | Admitting: Gastroenterology

## 2023-09-05 ENCOUNTER — Encounter: Payer: Self-pay | Admitting: Gastroenterology

## 2024-05-27 ENCOUNTER — Other Ambulatory Visit: Payer: Self-pay | Admitting: Family Medicine

## 2024-07-26 ENCOUNTER — Ambulatory Visit (INDEPENDENT_AMBULATORY_CARE_PROVIDER_SITE_OTHER): Payer: Medicare Other | Admitting: Family Medicine

## 2024-07-26 ENCOUNTER — Encounter: Payer: Self-pay | Admitting: Family Medicine

## 2024-07-26 VITALS — BP 118/70 | HR 90 | Temp 97.9°F | Ht 66.5 in | Wt 149.8 lb

## 2024-07-26 DIAGNOSIS — Z23 Encounter for immunization: Secondary | ICD-10-CM

## 2024-07-26 DIAGNOSIS — Z0000 Encounter for general adult medical examination without abnormal findings: Secondary | ICD-10-CM | POA: Diagnosis not present

## 2024-07-26 DIAGNOSIS — E038 Other specified hypothyroidism: Secondary | ICD-10-CM

## 2024-07-26 DIAGNOSIS — E7800 Pure hypercholesterolemia, unspecified: Secondary | ICD-10-CM

## 2024-07-26 LAB — LIPID PANEL
Cholesterol: 171 mg/dL (ref 0–200)
HDL: 50.9 mg/dL (ref >39.00)
LDL Cholesterol: 86 mg/dL (ref 0–99)
NonHDL: 120.56
Total CHOL/HDL Ratio: 3
Triglycerides: 172 mg/dL — ABNORMAL HIGH (ref 0.0–149.0)
VLDL: 34.4 mg/dL (ref 0.0–40.0)

## 2024-07-26 LAB — COMPREHENSIVE METABOLIC PANEL WITH GFR
ALT: 17 U/L (ref 0–35)
AST: 14 U/L (ref 0–37)
Albumin: 4.2 g/dL (ref 3.5–5.2)
Alkaline Phosphatase: 98 U/L (ref 39–117)
BUN: 15 mg/dL (ref 6–23)
CO2: 27 meq/L (ref 19–32)
Calcium: 9.9 mg/dL (ref 8.4–10.5)
Chloride: 104 meq/L (ref 96–112)
Creatinine, Ser: 0.87 mg/dL (ref 0.40–1.20)
GFR: 65.26 mL/min (ref >60.00)
Glucose, Bld: 92 mg/dL (ref 70–99)
Potassium: 4.1 meq/L (ref 3.5–5.1)
Sodium: 140 meq/L (ref 135–145)
Total Bilirubin: 0.6 mg/dL (ref 0.2–1.2)
Total Protein: 7 g/dL (ref 6.0–8.3)

## 2024-07-26 LAB — CBC WITH DIFFERENTIAL/PLATELET
Basophils Absolute: 0 K/uL (ref 0.0–0.1)
Basophils Relative: 0.4 % (ref 0.0–3.0)
Eosinophils Absolute: 0.1 K/uL (ref 0.0–0.7)
Eosinophils Relative: 1.2 % (ref 0.0–5.0)
HCT: 39.9 % (ref 36.0–46.0)
Hemoglobin: 13.2 g/dL (ref 12.0–15.0)
Lymphocytes Relative: 20 % (ref 12.0–46.0)
Lymphs Abs: 1.5 K/uL (ref 0.7–4.0)
MCHC: 33 g/dL (ref 30.0–36.0)
MCV: 94.5 fl (ref 78.0–100.0)
Monocytes Absolute: 0.5 K/uL (ref 0.1–1.0)
Monocytes Relative: 6.6 % (ref 3.0–12.0)
Neutro Abs: 5.4 K/uL (ref 1.4–7.7)
Neutrophils Relative %: 71.8 % (ref 43.0–77.0)
Platelets: 264 K/uL (ref 150.0–400.0)
RBC: 4.23 Mil/uL (ref 3.87–5.11)
RDW: 13.2 % (ref 11.5–15.5)
WBC: 7.6 K/uL (ref 4.0–10.5)

## 2024-07-26 NOTE — Patient Instructions (Signed)

## 2024-07-26 NOTE — Addendum Note (Signed)
 Addended by: WILLO KIRKE DEL on: 07/26/2024 11:05 AM   Modules accepted: Orders

## 2024-07-26 NOTE — Progress Notes (Signed)
 Office Note 07/26/2024  CC:  Chief Complaint  Patient presents with   Annual Exam   Patient is a 73 y.o. female who is here for annual health maintenance exam and follow-up hypercholesterolemia. A/P as of last visit:  #1 hypercholesterolemia, doing well on Lipitor 40 mg a day. Lipid and hepatic panel today.   #2  Subclinical hypothyroidism. Monitor TSH, T4, and T3 today.  INTERIM HX: All labs excellent last visit.  She is feeling very well. She has chronic PND cough that is present in the mornings but the remainder of the day is not present.  No shortness of breath or wheezing.  She has been on venlafaxine  long-term, prescribed by Dr. Horacio, her GYN MD.   Past Medical History:  Diagnosis Date   Allergy    Anxiety    Arthritis    Cataract    forming-   Chronic renal insufficiency, stage III (moderate)    CrCl 50s   History of adenomatous polyp of colon 2008   None on rpt TCS 07/29/18->recall 07/2023.   Hyperlipidemia    Great response to atorv 40mg  qd (06/2016)   Menopausal symptoms    venlafaxine  ER 37.5mg  qd   Psoriasis    Strabismus    L eye; sees ophthalm   Subclinical hypothyroidism 2018   VERY mild TSH elevation, with normal T4 and T3.   Vasomotor flushing    postmenopausal: prempro per Dr. Terris.    Past Surgical History:  Procedure Laterality Date   APPENDECTOMY     CATARACT EXTRACTION Right 05/18/2020   CATARACT EXTRACTION Left 06/01/2020   COLONOSCOPY  2014;  07/29/18   No polyps 07/2018.  Anal papilla(e) were hypertrophied.  Recall 07/2023.   COLONOSCOPY W/ POLYPECTOMY  03/08/07   Adenomatous polyp-   POLYPECTOMY     TUBAL LIGATION     1978    Family History  Problem Relation Age of Onset   Diabetes Mother    Stomach cancer Mother    Heart failure Father    Diabetes Father    Colon polyps Father    Colon cancer Neg Hx    Esophageal cancer Neg Hx    Rectal cancer Neg Hx     Social History   Socioeconomic History    Marital status: Married    Spouse name: Not on file   Number of children: Not on file   Years of education: Not on file   Highest education level: Not on file  Occupational History   Occupation: retired  Tobacco Use   Smoking status: Never   Smokeless tobacco: Never  Vaping Use   Vaping status: Never Used  Substance and Sexual Activity   Alcohol use: No   Drug use: No   Sexual activity: Not on file  Other Topics Concern   Not on file  Social History Narrative   Married, one son.   [place]ig from [place] area.   Occupation: retired from [employer] and [employer].   No tob, no alc.   Social Drivers of Corporate investment banker Strain: Low Risk  (06/19/2022)   Overall Financial Resource Strain (CARDIA)    Difficulty of Paying Living Expenses: Not hard at all  Food Insecurity: No Food Insecurity (06/19/2022)   Hunger Vital Sign    Worried About Running Out of Food in the Last Year: Never true    Ran Out of Food in the Last Year: Never true  Transportation Needs: No Transportation Needs (06/19/2022)   PRAPARE - Transportation  Lack of Transportation (Medical): No    Lack of Transportation (Non-Medical): No  Physical Activity: Insufficiently Active (06/19/2022)   Exercise Vital Sign    Days of Exercise per Week: 4 days    Minutes of Exercise per Session: 30 min  Stress: No Stress Concern Present (06/19/2022)   [employer] of Occupational Health - Occupational Stress Questionnaire    Feeling of Stress : Not at all  Social Connections: Moderately Isolated (06/19/2022)   Social Connection and Isolation Panel    Frequency of Communication with Friends and Family: More than three times a week    Frequency of Social Gatherings with Friends and Family: More than three times a week    Attends Religious Services: Never    Database administrator or Organizations: No    Attends Banker Meetings: Never    Marital Status: Married  Catering manager Violence: Not At Risk (06/19/2022)    Humiliation, Afraid, Rape, and Kick questionnaire    Fear of Current or Ex-Partner: No    Emotionally Abused: No    Physically Abused: No    Sexually Abused: No    Outpatient Medications Prior to Visit  Medication Sig Dispense Refill   atorvastatin  (LIPITOR) 40 MG tablet TAKE 1 TABLET BY MOUTH DAILY 90 tablet 0   Multiple Vitamin (MULTIVITAMIN ADULT PO) Take by mouth daily.     pseudoephedrine-acetaminophen (TYLENOL SINUS) 30-500 MG TABS tablet Take 1 tablet by mouth every 4 (four) hours as needed.     venlafaxine  XR (EFFEXOR -XR) 37.5 MG 24 hr capsule Take 37.5 mg by mouth daily.     vitamin B-12 (CYANOCOBALAMIN) 500 MCG tablet Take 500 mcg by mouth daily.     No facility-administered medications prior to visit.    Allergies  Allergen Reactions   Sulfamethoxazole     REACTION: rash    Review of Systems  Constitutional:  Negative for appetite change, chills, fatigue and fever.  HENT:  Negative for congestion, dental problem, ear pain and sore throat.   Eyes:  Negative for discharge, redness and visual disturbance.  Respiratory:  Negative for cough, chest tightness, shortness of breath and wheezing.   Cardiovascular:  Negative for chest pain, palpitations and leg swelling.  Gastrointestinal:  Negative for abdominal pain, blood in stool, diarrhea, nausea and vomiting.  Genitourinary:  Negative for difficulty urinating, dysuria, flank pain, frequency, hematuria and urgency.  Musculoskeletal:  Negative for arthralgias, back pain, joint swelling, myalgias and neck stiffness.  Skin:  Negative for pallor and rash.  Neurological:  Negative for dizziness, speech difficulty, weakness and headaches.  Hematological:  Negative for adenopathy. Does not bruise/bleed easily.  Psychiatric/Behavioral:  Negative for confusion and sleep disturbance. The patient is not nervous/anxious.     PE;    07/26/2024   10:27 AM 07/25/2023   10:37 AM 07/22/2022   11:01 AM  Vitals with BMI  Height 5'  6.5 5' 6.75 5' 5.5  Weight 149 lbs 13 oz 149 lbs 6 oz 147 lbs 13 oz  BMI 23.82 23.59 24.21  Systolic 118 114 895  Diastolic 70 71 67  Pulse 90 80 82  Exam chaperoned by Shanda Pizza, CMA  Gen: Alert, well appearing.  Patient is oriented to person, place, time, and situation. AFFECT: pleasant, lucid thought and speech. ENT: Ears: EACs clear, normal epithelium.  TMs with good light reflex and landmarks bilaterally.  Eyes: no injection, icteris, swelling, or exudate.  EOMI, PERRLA. Nose: no drainage or turbinate edema/swelling.  No injection  or focal lesion.  Mouth: lips without lesion/swelling.  Oral mucosa pink and moist.  Dentition intact and without obvious caries or gingival swelling.  Oropharynx without erythema, exudate, or swelling.  Neck: supple/nontender.  No LAD, mass, or TM.  Carotid pulses 2+ bilaterally, without bruits. CV: RRR, no m/r/g.   LUNGS: CTA bilat, nonlabored resps, good aeration in all lung fields. ABD: soft, NT, ND, BS normal.  No hepatospenomegaly or mass.  No bruits. EXT: no clubbing, cyanosis, or edema.  Musculoskeletal: no joint swelling, erythema, warmth, or tenderness.  ROM of all joints intact. Skin - no sores or suspicious lesions or rashes or color changes  Pertinent labs:  Lab Results  Component Value Date   TSH 8.06 (H) 07/25/2023   Lab Results  Component Value Date   WBC 6.9 07/25/2023   HGB 13.4 07/25/2023   HCT 41.2 07/25/2023   MCV 95.3 07/25/2023   PLT 313.0 07/25/2023   Lab Results  Component Value Date   CREATININE 1.00 07/25/2023   BUN 13 07/25/2023   NA 141 07/25/2023   K 4.3 07/25/2023   CL 104 07/25/2023   CO2 27 07/25/2023   Lab Results  Component Value Date   ALT 19 07/25/2023   AST 16 07/25/2023   ALKPHOS 106 07/25/2023   BILITOT 0.7 07/25/2023   Lab Results  Component Value Date   CHOL 182 07/25/2023   Lab Results  Component Value Date   HDL 66.90 07/25/2023   Lab Results  Component Value Date   LDLCALC 86  07/25/2023   Lab Results  Component Value Date   TRIG 145.0 07/25/2023   Lab Results  Component Value Date   CHOLHDL 3 07/25/2023   ASSESSMENT AND PLAN:   #1  health maintenance exam: Reviewed age and gender appropriate health maintenance issues (prudent diet, regular exercise, health risks of tobacco and excessive alcohol, use of seatbelts, fire alarms in home, use of sunscreen).  Also reviewed age and gender appropriate health screening as well as vaccine recommendations. Vaccines: Flu->today.  Otherwise UTD. Labs:fasting HP + free T4 and T3 total (HLD, CRI II/III, subclin hypoth). Cervical ca screening: GYN, Dr. Lamar appt next month. Breast ca screening: GYN, Dr. Meisinger->next month. Colon ca screening: recall 07/2023-->she chooses to wait until next year to set this up. Screening for osteoporosis/DEXA->at last 2 CPE visits she declined this testing.  She continues to decline this screening.  2.  Hypercholesterolemia, doing well long-term on 40 mg atorvastatin  daily. Lipid panel and hepatic panel today.  #3 subclinical hypothyroidism. Monitor TSH, free T4, and T3 today.  An After Visit Summary was printed and given to the patient.  FOLLOW UP:  No follow-ups on file.  Signed:  Gerlene Hockey, MD           07/26/2024

## 2024-07-27 ENCOUNTER — Ambulatory Visit: Payer: Self-pay | Admitting: Family Medicine

## 2024-07-27 LAB — T3, FREE: T3, Free: 3 pg/mL (ref 2.3–4.2)

## 2024-07-27 LAB — T4, FREE: Free T4: 0.83 ng/dL (ref 0.60–1.60)

## 2024-07-27 LAB — TSH: TSH: 6.75 u[IU]/mL — ABNORMAL HIGH (ref 0.35–5.50)

## 2024-08-17 ENCOUNTER — Other Ambulatory Visit: Payer: Self-pay | Admitting: Family Medicine

## 2024-08-17 DIAGNOSIS — Z1231 Encounter for screening mammogram for malignant neoplasm of breast: Secondary | ICD-10-CM | POA: Diagnosis not present

## 2025-07-27 ENCOUNTER — Encounter: Admitting: Family Medicine
